# Patient Record
Sex: Male | Born: 1990 | Race: White | Hispanic: No | Marital: Married | Smoking: Former smoker
Health system: Southern US, Community
[De-identification: ages and names within clinical notes are randomized; demographics above are authoritative.]

## PROBLEM LIST (undated history)

## (undated) DIAGNOSIS — F819 Developmental disorder of scholastic skills, unspecified: Secondary | ICD-10-CM

## (undated) DIAGNOSIS — IMO0001 Reserved for inherently not codable concepts without codable children: Secondary | ICD-10-CM

## (undated) DIAGNOSIS — H9190 Unspecified hearing loss, unspecified ear: Secondary | ICD-10-CM

## (undated) DIAGNOSIS — F909 Attention-deficit hyperactivity disorder, unspecified type: Secondary | ICD-10-CM

## (undated) DIAGNOSIS — Z20828 Contact with and (suspected) exposure to other viral communicable diseases: Secondary | ICD-10-CM

## (undated) DIAGNOSIS — Z974 Presence of external hearing-aid: Secondary | ICD-10-CM

## (undated) DIAGNOSIS — K219 Gastro-esophageal reflux disease without esophagitis: Secondary | ICD-10-CM

## (undated) HISTORY — DX: Reserved for inherently not codable concepts without codable children: IMO0001

## (undated) HISTORY — PX: NO PAST SURGERIES: SHX2092

## (undated) HISTORY — DX: Attention-deficit hyperactivity disorder, unspecified type: F90.9

## (undated) HISTORY — DX: Developmental disorder of scholastic skills, unspecified: F81.9

## (undated) HISTORY — DX: Presence of external hearing-aid: Z97.4

## (undated) HISTORY — DX: Gastro-esophageal reflux disease without esophagitis: K21.9

---
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
## 2010-02-09 ENCOUNTER — Emergency Department (HOSPITAL_COMMUNITY): Admission: EM | Admit: 2010-02-09 | Discharge: 2010-02-10 | Payer: Self-pay | Admitting: Emergency Medicine

## 2010-06-02 LAB — CBC
HCT: 34.7 % — ABNORMAL LOW (ref 39.0–52.0)
Hemoglobin: 12.5 g/dL — ABNORMAL LOW (ref 13.0–17.0)
MCH: 31.3 pg (ref 26.0–34.0)
MCHC: 36 g/dL (ref 30.0–36.0)
RDW: 11.3 % — ABNORMAL LOW (ref 11.5–15.5)

## 2010-06-02 LAB — COMPREHENSIVE METABOLIC PANEL
CO2: 27 mEq/L (ref 19–32)
Calcium: 8.6 mg/dL (ref 8.4–10.5)
Creatinine, Ser: 0.82 mg/dL (ref 0.4–1.5)
GFR calc Af Amer: >60 mL/min (ref >60)
GFR calc non Af Amer: >60 mL/min (ref >60)
Glucose, Bld: 102 mg/dL — ABNORMAL HIGH (ref 70–99)

## 2010-06-02 LAB — URINALYSIS, ROUTINE W REFLEX MICROSCOPIC
Ketones, ur: NEGATIVE mg/dL
Nitrite: NEGATIVE
Specific Gravity, Urine: 1.01 (ref 1.005–1.030)
pH: 7 (ref 5.0–8.0)

## 2010-06-02 LAB — DIFFERENTIAL
Lymphocytes Relative: 28 % (ref 12–46)
Lymphs Abs: 2.2 10*3/uL (ref 0.7–4.0)
Neutro Abs: 3.9 10*3/uL (ref 1.7–7.7)
Neutrophils Relative %: 51 % (ref 43–77)

## 2011-10-10 ENCOUNTER — Emergency Department (HOSPITAL_COMMUNITY): Payer: PRIVATE HEALTH INSURANCE

## 2011-10-10 ENCOUNTER — Emergency Department (HOSPITAL_COMMUNITY)
Admission: EM | Admit: 2011-10-10 | Discharge: 2011-10-10 | Disposition: A | Discharge status: Home / Self Care | Payer: PRIVATE HEALTH INSURANCE | Attending: Emergency Medicine | Admitting: Emergency Medicine

## 2011-10-10 ENCOUNTER — Encounter (HOSPITAL_COMMUNITY): Payer: Self-pay | Admitting: *Deleted

## 2011-10-10 DIAGNOSIS — F17200 Nicotine dependence, unspecified, uncomplicated: Secondary | ICD-10-CM | POA: Insufficient documentation

## 2011-10-10 DIAGNOSIS — R109 Unspecified abdominal pain: Secondary | ICD-10-CM | POA: Insufficient documentation

## 2011-10-10 DIAGNOSIS — R509 Fever, unspecified: Secondary | ICD-10-CM | POA: Insufficient documentation

## 2011-10-10 DIAGNOSIS — R110 Nausea: Secondary | ICD-10-CM | POA: Insufficient documentation

## 2011-10-10 HISTORY — DX: Contact with and (suspected) exposure to other viral communicable diseases: Z20.828

## 2011-10-10 LAB — CBC WITH DIFFERENTIAL/PLATELET
Hemoglobin: 14.7 g/dL (ref 13.0–17.0)
Lymphocytes Relative: 36 % (ref 12–46)
Lymphs Abs: 1.2 10*3/uL (ref 0.7–4.0)
MCH: 31.4 pg (ref 26.0–34.0)
Monocytes Relative: 15 % — ABNORMAL HIGH (ref 3–12)
Neutro Abs: 1.6 10*3/uL — ABNORMAL LOW (ref 1.7–7.7)
Neutrophils Relative %: 48 % (ref 43–77)
RBC: 4.68 MIL/uL (ref 4.22–5.81)

## 2011-10-10 LAB — URINALYSIS, ROUTINE W REFLEX MICROSCOPIC
Glucose, UA: NEGATIVE mg/dL
Hgb urine dipstick: NEGATIVE
Protein, ur: NEGATIVE mg/dL

## 2011-10-10 LAB — COMPREHENSIVE METABOLIC PANEL
AST: 22 U/L (ref 0–37)
Albumin: 3.7 g/dL (ref 3.5–5.2)
Chloride: 96 mEq/L (ref 96–112)
Creatinine, Ser: 0.95 mg/dL (ref 0.50–1.35)
Potassium: 3.7 mEq/L (ref 3.5–5.1)
Total Bilirubin: 0.4 mg/dL (ref 0.3–1.2)

## 2011-10-10 LAB — LIPASE, BLOOD: Lipase: 27 U/L (ref 11–59)

## 2011-10-10 LAB — CK: Total CK: 72 U/L (ref 7–232)

## 2011-10-10 MED ORDER — ONDANSETRON HCL 4 MG/2ML IJ SOLN
4.0000 mg | Freq: Once | INTRAMUSCULAR | Status: AC
  Administered 2011-10-10: 4 mg via INTRAVENOUS
  Filled 2011-10-10: qty 2

## 2011-10-10 MED ORDER — ACETAMINOPHEN 325 MG PO TABS
650.0000 mg | ORAL_TABLET | Freq: Once | ORAL | Status: AC
  Administered 2011-10-10: 650 mg via ORAL
  Filled 2011-10-10: qty 2

## 2011-10-10 MED ORDER — SODIUM CHLORIDE 0.9 % IV BOLUS (SEPSIS): 1000.0000 mL | Freq: Once | INTRAVENOUS | Status: DC

## 2011-10-10 MED ORDER — SODIUM CHLORIDE 0.9 % IV BOLUS (SEPSIS)
1000.0000 mL | Freq: Once | INTRAVENOUS | Status: AC
  Administered 2011-10-10: 1000 mL via INTRAVENOUS

## 2011-10-10 MED ORDER — MORPHINE SULFATE 4 MG/ML IJ SOLN
4.0000 mg | Freq: Once | INTRAMUSCULAR | Status: AC
  Administered 2011-10-10: 4 mg via INTRAVENOUS
  Filled 2011-10-10: qty 1

## 2011-10-10 MED ORDER — IOHEXOL 300 MG/ML  SOLN
100.0000 mL | Freq: Once | INTRAMUSCULAR | Status: AC | PRN
  Administered 2011-10-10: 100 mL via INTRAVENOUS

## 2011-10-10 NOTE — ED Provider Notes (Signed)
History     CSN: [id number]  Arrival date & time 10/10/11  0003   First MD Initiated Contact with Patient 10/10/11 0036      Chief Complaint  Patient presents with  . Abdominal Pain    Patient is a 21 y.o. male presenting with abdominal pain. The history is provided by the patient and a relative.  Abdominal Pain The primary symptoms of the illness include abdominal pain, fever, fatigue and nausea. The primary symptoms of the illness do not include shortness of breath, vomiting or diarrhea. The current episode started more than 2 days ago. The onset of the illness was gradual. The problem has been gradually worsening.  Additional symptoms associated with the illness include chills and back pain. Associated symptoms comments: Cough Headache .   pt presents for abdominal pain for several days He also has had headache, cough as well No rash No significant sore throat  Pt started having fever about 5 days ago.  He then developed myalgias and abdominal pain He has seen PCP twice - the first time labs were drawn that "were normal" and suspected RMSF and was started on doxycycline.  He had tick bites about 3 weeks prior (less than 48 hours of exposure) He has not improved, saw PCP again and mono test that was positive     Past Medical History  Diagnosis Date  . 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 No pertinent past surgical history.  History reviewed. No pertinent family history.  History  Substance Use Topics  . Smoking status: Current Everyday Smoker  . Smokeless tobacco: Not on file  . Alcohol Use: No      Review of Systems  Constitutional: Positive for fever, chills and fatigue.  Respiratory: Negative for shortness of breath.   Gastrointestinal: Positive for nausea and abdominal pain. Negative for vomiting and diarrhea.  Musculoskeletal: Positive for back pain.  Skin: Negative for rash.  All other systems reviewed and are negative.    Allergies  Review of  patient's allergies indicates no known allergies.  Home Medications   Current Outpatient Rx  Name Route Sig Dispense Refill  . DOXYCYCLINE HYCLATE 100 MG PO CAPS Oral Take 100 mg by mouth 2 (two) times daily.    Marland Kitchen HYDROCODONE-ACETAMINOPHEN 5-325 MG PO TABS Oral Take 1 tablet by mouth every 6 (six) hours as needed.      BP 144/85  Pulse 96  Temp 101.4 F (38.6 C) (Oral)  Resp 20  Ht 6' (1.829 m)  Wt 148 lb (67.132 kg)  BMI 20.07 kg/m2  SpO2 99%  Physical Exam CONSTITUTIONAL: Well developed/well nourished HEAD AND FACE: Normocephalic/atraumatic EYES: EOMI/PERRL, no icterus ENMT: Mucous membranes moist, pharynx mildly erythematous, uvula midline NECK: supple no meningeal signs SPINE:entire spine nontender CV: S1/S2 noted, no murmurs/rubs/gallops noted LUNGS: Lungs are clear to auscultation bilaterally, no apparent distress ABDOMEN: soft, diffuse tenderness noted in RUQ and LUQ.  No significant lower abd tendernes no rebound or guarding GU:no cva tenderness NEURO: Pt is awake/alert, moves all extremitiesx4 EXTREMITIES: pulses normal, full ROM SKIN: warm, color normal, no rash PSYCH: no abnormalities of mood noted  ED Course  Procedures  Labs Reviewed  COMPREHENSIVE METABOLIC PANEL - Abnormal; Notable for the following:    Sodium 132 (*)     Glucose, Bld 103 (*)     All other components within normal limits  CBC WITH DIFFERENTIAL - Abnormal; Notable for the following:    WBC 3.3 (*)  RDW 11.4 (*)     Platelets 88 (*)     Neutro Abs 1.6 (*)     Monocytes Relative 15 (*)     Basophils Relative 2 (*)     All other components within normal limits  LIPASE, BLOOD  CK  LACTIC ACID, PLASMA  URINALYSIS, ROUTINE W REFLEX MICROSCOPIC   Dg Chest Port 1 View  10/10/2011  *RADIOLOGY REPORT*  Clinical Data: Abdominal pain.  Nausea, fever for 5 days.  PORTABLE CHEST - 1 VIEW  Comparison: None.  Findings: Cardiomediastinal silhouette is within normal limits. Lungs are free of  focal consolidations and pleural effusions.  No edema.  IMPRESSION: No evidence for acute cardiopulmonary abnormality.  Original Report Authenticated By: Patterson Hammersmith, M.D.   1:50 AM Pt with significant abdominal tenderness, CT imaging ordered Will follow closely  4:10 AM Pt improved Taking PO Vitals improved Abdomen soft on my exam Only abnormality is thrombocytopenia, which could be from viral process, or potentially tick borne illness He is already on doxycycline He is awake/alert, no signs of meningitis.  I don't see any rash consistent with tick illness He is already supposed to see his PCP next week when his doxy is complete. Discussed strict return precautions Patient/family agreeable  MDM  Nursing notes including past medical history and social history reviewed and considered in documentation xrays reviewed and considered labs/vitals reviewed and considered         Joya Gaskins, MD 10/10/11 [phone]

## 2011-10-10 NOTE — ED Notes (Signed)
Patient states he is feeling much better, states he is still pain free.  No nausea or vomiting.

## 2011-10-10 NOTE — ED Notes (Signed)
PO fluids offered.  Drinking without difficulty, no complain of nausea, no vomiting.

## 2011-10-10 NOTE — ED Notes (Signed)
Per family, pt has been diagnosed with mono on Tuesday, continued abdominal pain since that time.  No nausea or vomiting.

## 2011-10-10 NOTE — ED Notes (Signed)
States he has been sick since Tuesday, states he was diagnosed with mono.  He complains of left sided abdominal pain.

## 2012-06-01 ENCOUNTER — Emergency Department (HOSPITAL_COMMUNITY)
Admission: EM | Admit: 2012-06-01 | Discharge: 2012-06-02 | Disposition: A | Discharge status: Home / Self Care | Payer: PRIVATE HEALTH INSURANCE | Attending: Emergency Medicine | Admitting: Emergency Medicine

## 2012-06-01 ENCOUNTER — Encounter (HOSPITAL_COMMUNITY): Payer: Self-pay | Admitting: Emergency Medicine

## 2012-06-01 DIAGNOSIS — Z79899 Other long term (current) drug therapy: Secondary | ICD-10-CM | POA: Insufficient documentation

## 2012-06-01 DIAGNOSIS — Y929 Unspecified place or not applicable: Secondary | ICD-10-CM | POA: Insufficient documentation

## 2012-06-01 DIAGNOSIS — S0180XA Unspecified open wound of other part of head, initial encounter: Secondary | ICD-10-CM | POA: Insufficient documentation

## 2012-06-01 DIAGNOSIS — W208XXA Other cause of strike by thrown, projected or falling object, initial encounter: Secondary | ICD-10-CM | POA: Insufficient documentation

## 2012-06-01 DIAGNOSIS — S0003XA Contusion of scalp, initial encounter: Secondary | ICD-10-CM | POA: Insufficient documentation

## 2012-06-01 DIAGNOSIS — H9190 Unspecified hearing loss, unspecified ear: Secondary | ICD-10-CM | POA: Insufficient documentation

## 2012-06-01 DIAGNOSIS — F17200 Nicotine dependence, unspecified, uncomplicated: Secondary | ICD-10-CM | POA: Insufficient documentation

## 2012-06-01 DIAGNOSIS — Y9389 Activity, other specified: Secondary | ICD-10-CM | POA: Insufficient documentation

## 2012-06-01 HISTORY — DX: Unspecified hearing loss, unspecified ear: H91.90

## 2012-06-01 MED ORDER — DOUBLE ANTIBIOTIC 500-10000 UNIT/GM EX OINT
TOPICAL_OINTMENT | Freq: Once | CUTANEOUS | Status: AC
  Administered 2012-06-01: 1 via TOPICAL
  Filled 2012-06-01: qty 1

## 2012-06-01 MED ORDER — LIDOCAINE-EPINEPHRINE (PF) 1 %-1:200000 IJ SOLN
INTRAMUSCULAR | Status: AC
  Administered 2012-06-01: 10 mL
  Filled 2012-06-01: qty 10

## 2012-06-01 MED ORDER — HYDROCODONE-ACETAMINOPHEN 5-325 MG PO TABS: 1.0000 | ORAL_TABLET | ORAL | Status: DC | PRN

## 2012-06-01 NOTE — ED Provider Notes (Signed)
Patient was working on his vehicle and the old broke and his ranch flew back and hit him in the right forehead. He did not have loss of consciousness. He has a vertically placed laceration in his mid eyebrow that extends into the upper eyelid but does not involve the orbital fat. His globe appears intact. The laceration is through the dermis into the subcutaneous tissue. There is no active bleeding at this point. He did feel a little weak and lightheaded when he first walked into the emergency department but he feels better now.  His last tetanus is unknown.  He has a family member with him who is a Engineer, civil (consulting) and can do neuro checks on him at home.   Medical screening examination/treatment/procedure(s) were conducted as a shared visit with non-physician practitioner(s) and myself.  I personally evaluated the patient during the encounter  Devoria Albe, MD, Franz Dell, MD 06/01/12 [phone]

## 2012-06-01 NOTE — ED Notes (Signed)
Patient was working on a vehicle and a bolt broke and the wrench hit him in the head; denies LOC.  Patient with approximately 2 inch laceration noted through right eyebrow.  Patient is dizzy when he stands.

## 2012-06-01 NOTE — ED Provider Notes (Signed)
History     CSN: [id number]  Arrival date & time 06/01/12  2020   First MD Initiated Contact with Patient 06/01/12 2253      Chief Complaint  Patient presents with  . Head Laceration    (Consider location/radiation/quality/duration/timing/severity/associated sxs/prior treatment) Patient is a 22 y.o. male presenting with scalp laceration. The history is provided by the patient.  Head Laceration This is a new problem. The current episode started today. The problem occurs constantly. The problem has been unchanged. Associated symptoms include headaches. Pertinent negatives include no nausea or vomiting. Nothing aggravates the symptoms. He has tried nothing for the symptoms.  patient was working on a car pulling out a transmission and a wrench slipped and flew off and hit him in the head just above his right eye. Bleeding is controlled.  Past Medical History  Diagnosis Date  . 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 Hard of hearing     History reviewed. No pertinent past surgical history.  No family history on file.  History  Substance Use Topics  . Smoking status: Current Every Day Smoker  . Smokeless tobacco: Not on file  . Alcohol Use: Yes      Review of Systems  HENT: Negative for facial swelling.        Laceration   Gastrointestinal: Negative for nausea and vomiting.  Skin: Positive for wound.  Allergic/Immunologic: Negative for immunocompromised state.  Neurological: Positive for headaches.  Psychiatric/Behavioral: Negative for confusion.    Allergies  Review of patient's allergies indicates no known allergies.  Home Medications   Current Outpatient Rx  Name  Route  Sig  Dispense  Refill  . doxycycline (VIBRAMYCIN) 100 MG capsule   Oral   Take 100 mg by mouth 2 (two) times daily.         Marland Kitchen HYDROcodone-acetaminophen (NORCO/VICODIN) 5-325 MG per tablet   Oral   Take 1 tablet by mouth every 6 (six) hours as needed.           BP 130/88  Pulse 77  Temp(Src) 97.9 F  (36.6 C) (Oral)  Resp 24  Ht 6' (1.829 m)  Wt 150 lb (68.04 kg)  BMI 20.34 kg/m2  SpO2 100%  Physical Exam  Constitutional: He is oriented to person, place, and time. He appears well-developed and well-nourished. No distress.  HENT:  Head: Head is with laceration.    Eyes: Conjunctivae and EOM are normal. Pupils are equal, round, and reactive to light.  Neck: Normal range of motion. Neck supple.  Cardiovascular: Normal rate.   Pulmonary/Chest: Effort normal.  Musculoskeletal: Normal range of motion.  Neurological: He is alert and oriented to person, place, and time.  Psychiatric: He has a normal mood and affect. His behavior is normal. Judgment and thought content normal.    ED Course  Procedures (including critical care time) LACERATION REPAIR Performed by: NEESE,HOPE Authorized by: NEESE,HOPE Consent: Verbal consent obtained. Risks and benefits: risks, benefits and alternatives were discussed Consent given by: patient Patient identity confirmed: provided demographic data Prepped and Draped in normal sterile fashion Wound explored  Laceration Location: face  Laceration Length: 5 cm  No Foreign Bodies seen or palpated  Anesthesia: local infiltration  Local anesthetic: lidocaine 1%  With epinephrine  Anesthetic total: 3 ml  Irrigation method: syringe Amount of cleaning: standard  Skin closure: sub Q with 5-0 vicryl and skin with  6-0 prolene  Number of sutures: 4 sub Q and 13 skin  Technique: simple interrupted  Patient tolerance: Patient  tolerated the procedure well with no immediate complications.   MDM  22 y.o. male who presents to the ED with a facial laceration. He denies LOC. He is alert and oriented. Dr. Lars Mage in to examine the patient and discussed close observation and/or CT scan.  His wife is a Engineer, civil (consulting) and she feels she can observe the patient and return for any problems. Will d/c home to follow up with Dr. Gerda Diss for suture removal in 5 days .  Head injury precautions and sutured wound care instructions given prior to discharge.   I have reviewed this patient's vital signs, nurses notes and discussed plan of care with patient and his wife.    Medication List    TAKE these medications       HYDROcodone-acetaminophen 5-325 MG per tablet  Commonly known as:  NORCO/VICODIN  Take 1 tablet by mouth every 4 (four) hours as needed for pain.      ASK your doctor about these medications       calcium carbonate 1250 MG chewable tablet  Commonly known as:  OS-CAL  Chew 1 tablet by mouth daily as needed for heartburn.             Janne Napoleon, [place] 06/01/12 [phone]

## 2012-06-02 NOTE — ED Provider Notes (Signed)
See prior note    Ward Givens, MD 06/02/12 [phone]

## 2012-06-07 ENCOUNTER — Ambulatory Visit (INDEPENDENT_AMBULATORY_CARE_PROVIDER_SITE_OTHER): Payer: PRIVATE HEALTH INSURANCE | Admitting: Family Medicine

## 2012-06-07 ENCOUNTER — Encounter: Payer: Self-pay | Admitting: Family Medicine

## 2012-06-07 VITALS — BP 134/92 | Temp 99.0°F | Wt 161.8 lb

## 2012-06-07 DIAGNOSIS — N419 Inflammatory disease of prostate, unspecified: Secondary | ICD-10-CM

## 2012-06-07 LAB — POCT URINALYSIS DIP (MANUAL ENTRY)
Glucose, UA: NEGATIVE
Nitrite, UA: NEGATIVE
Spec Grav, UA: <=1.005
Urobilinogen, UA: NEGATIVE

## 2012-06-07 LAB — POCT UA - MICROSCOPIC ONLY: WBC, Ur, HPF, POC: NEGATIVE

## 2012-06-07 MED ORDER — CIPROFLOXACIN HCL 500 MG PO TABS: 500.0000 mg | ORAL_TABLET | Freq: Two times a day (BID) | ORAL | Status: AC

## 2012-06-07 MED ORDER — ONDANSETRON 4 MG PO TBDP: 4.0000 mg | ORAL_TABLET | Freq: Three times a day (TID) | ORAL | Status: DC | PRN

## 2012-06-07 NOTE — Progress Notes (Signed)
  Subjective:    Patient ID: Tony Diaz, male    DOB: 19-Apr-1990, 22 y.o.   MRN: [id number]  Abdominal Pain This is a new problem. The current episode started in the past 7 days. The onset quality is gradual. The problem occurs constantly. The problem has been rapidly improving. The pain is located in the LLQ. The pain is at a severity of 3/10. The quality of the pain is aching. The abdominal pain does not radiate. Associated symptoms include dysuria, a fever and myalgias. Nothing aggravates the pain. He has tried acetaminophen for the symptoms. The treatment provided no relief.      Review of Systems  Constitutional: Positive for fever and chills.  Eyes: Positive for discharge.  Gastrointestinal: Positive for abdominal pain.  Genitourinary: Positive for dysuria and difficulty urinating (history of acute prostatiis tw months ago).  Musculoskeletal: Positive for myalgias.  All other systems reviewed and are negative.       Objective:   Physical Exam  Vitals reviewed. Constitutional: He appears well-developed.  HENT:  Head: Normocephalic.  Eyes: Conjunctivae are normal. Pupils are equal, round, and reactive to light. Right eye exhibits no discharge.  Neck: Normal range of motion.  Cardiovascular: Normal rate and regular rhythm.   No murmur heard. Abdominal: Soft. There is no tenderness.  Genitourinary:  Prostate gland distinctly swollen and tender           Assessment & Plan:  Impression #1 acute prostatitis. Accompanied by nausea. Plan Cipro twice a day for 34 days. Zofran as needed for nausea. Symptomatic care discussed. Warning signs discussed. WSL

## 2012-06-07 NOTE — Addendum Note (Signed)
Addended by: Donna Bernard on: 06/07/2012 04:03 PM   Modules accepted: Orders

## 2012-06-09 ENCOUNTER — Encounter: Payer: Self-pay | Admitting: *Deleted

## 2012-06-14 MED FILL — Hydrocodone-Acetaminophen Tab 5-325 MG: ORAL | Qty: 6 | Status: AC

## 2012-12-04 ENCOUNTER — Ambulatory Visit (INDEPENDENT_AMBULATORY_CARE_PROVIDER_SITE_OTHER): Payer: PRIVATE HEALTH INSURANCE | Admitting: Family Medicine

## 2012-12-04 ENCOUNTER — Encounter: Payer: Self-pay | Admitting: Family Medicine

## 2012-12-04 VITALS — BP 124/88 | Temp 99.0°F | Ht 72.0 in | Wt 159.2 lb

## 2012-12-04 DIAGNOSIS — N410 Acute prostatitis: Secondary | ICD-10-CM

## 2012-12-04 DIAGNOSIS — R300 Dysuria: Secondary | ICD-10-CM

## 2012-12-04 DIAGNOSIS — J329 Chronic sinusitis, unspecified: Secondary | ICD-10-CM

## 2012-12-04 LAB — POCT URINALYSIS DIPSTICK

## 2012-12-04 MED ORDER — ETODOLAC 400 MG PO TABS: 400.0000 mg | ORAL_TABLET | Freq: Two times a day (BID) | ORAL | Status: DC | PRN

## 2012-12-04 MED ORDER — CIPROFLOXACIN HCL 750 MG PO TABS: 750.0000 mg | ORAL_TABLET | Freq: Two times a day (BID) | ORAL | Status: AC

## 2012-12-04 NOTE — Progress Notes (Signed)
  Subjective:    Patient ID: Tony Diaz, male    DOB: [birth year], 22 y.o.   MRN: [id number]  Sinusitis This is a new problem. The current episode started in the past 7 days. The problem is unchanged. The maximum temperature recorded prior to his arrival was 100 - 100.9 F. The pain is moderate. Associated symptoms include congestion, coughing and sinus pressure. Past treatments include nothing. The treatment provided no relief.  Knee Pain  The incident occurred more than 1 week ago. There was no injury mechanism. The pain is present in the right knee and left knee. The quality of the pain is described as aching. The pain is at a severity of 6/10. The pain is moderate. The pain has been intermittent since onset. He reports no foreign bodies present. Nothing aggravates the symptoms. He has tried rest for the symptoms. The treatment provided no relief.  tooth ache in knee,  Patient also thinks he has a prostate infection. It started yesterday morning with the discomfort.  Increased urinary frewq, infected prostate hx,     Review of Systems  HENT: Positive for congestion and sinus pressure.   Respiratory: Positive for cough.        Objective:   Physical Exam Alert moderate malaise. HET moderate his congestion pharynx erythematous neck supple. Lungs clear heart regular in rhythm prostate gland inflamed tender next  Urine 2-4 white blood cells per high-power field.       Assessment & Plan:  Impression acute prostatitis. #2 rhinosinusitis. #3 knee pain. Plan try Lodine 400 twice a day with food when necessary for pain. Cipro 750 twice a day 21 days. Symptomatic care discussed. WSL

## 2013-02-07 ENCOUNTER — Encounter (HOSPITAL_COMMUNITY): Payer: Self-pay | Admitting: Emergency Medicine

## 2013-02-07 ENCOUNTER — Emergency Department (HOSPITAL_COMMUNITY)
Admission: EM | Admit: 2013-02-07 | Discharge: 2013-02-07 | Disposition: A | Discharge status: Home / Self Care | Payer: PRIVATE HEALTH INSURANCE | Attending: Emergency Medicine | Admitting: Emergency Medicine

## 2013-02-07 ENCOUNTER — Emergency Department (HOSPITAL_COMMUNITY): Payer: PRIVATE HEALTH INSURANCE

## 2013-02-07 DIAGNOSIS — S139XXA Sprain of joints and ligaments of unspecified parts of neck, initial encounter: Secondary | ICD-10-CM | POA: Insufficient documentation

## 2013-02-07 DIAGNOSIS — Y92410 Unspecified street and highway as the place of occurrence of the external cause: Secondary | ICD-10-CM | POA: Diagnosis not present

## 2013-02-07 DIAGNOSIS — S161XXA Strain of muscle, fascia and tendon at neck level, initial encounter: Secondary | ICD-10-CM

## 2013-02-07 DIAGNOSIS — H9190 Unspecified hearing loss, unspecified ear: Secondary | ICD-10-CM | POA: Diagnosis not present

## 2013-02-07 DIAGNOSIS — S335XXA Sprain of ligaments of lumbar spine, initial encounter: Secondary | ICD-10-CM | POA: Insufficient documentation

## 2013-02-07 DIAGNOSIS — Z8719 Personal history of other diseases of the digestive system: Secondary | ICD-10-CM | POA: Insufficient documentation

## 2013-02-07 DIAGNOSIS — Z789 Other specified health status: Secondary | ICD-10-CM | POA: Insufficient documentation

## 2013-02-07 DIAGNOSIS — Y9389 Activity, other specified: Secondary | ICD-10-CM | POA: Diagnosis not present

## 2013-02-07 DIAGNOSIS — S0993XA Unspecified injury of face, initial encounter: Secondary | ICD-10-CM | POA: Diagnosis present

## 2013-02-07 DIAGNOSIS — Z8659 Personal history of other mental and behavioral disorders: Secondary | ICD-10-CM | POA: Insufficient documentation

## 2013-02-07 DIAGNOSIS — S39012A Strain of muscle, fascia and tendon of lower back, initial encounter: Secondary | ICD-10-CM

## 2013-02-07 MED ORDER — CYCLOBENZAPRINE HCL 5 MG PO TABS: 5.0000 mg | ORAL_TABLET | Freq: Three times a day (TID) | ORAL | Status: DC | PRN

## 2013-02-07 MED ORDER — IBUPROFEN 600 MG PO TABS: 600.0000 mg | ORAL_TABLET | Freq: Four times a day (QID) | ORAL | Status: DC | PRN
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
## 2013-02-09 NOTE — ED Provider Notes (Signed)
Medical screening examination/treatment/procedure(s) were performed by non-physician practitioner and as supervising physician I was immediately available for consultation/collaboration.  Zissy Hamlett L Dewana Ammirati, MD 02/09/13 1518 

## 2013-04-30 ENCOUNTER — Encounter: Payer: Self-pay | Admitting: Family Medicine

## 2013-04-30 ENCOUNTER — Ambulatory Visit (INDEPENDENT_AMBULATORY_CARE_PROVIDER_SITE_OTHER): Payer: PRIVATE HEALTH INSURANCE | Admitting: Family Medicine

## 2013-04-30 VITALS — BP 118/78 | Temp 98.9°F | Ht 72.0 in | Wt 160.6 lb

## 2013-04-30 DIAGNOSIS — N410 Acute prostatitis: Secondary | ICD-10-CM

## 2013-04-30 MED ORDER — DOXYCYCLINE HYCLATE 100 MG PO TABS: 100.0000 mg | ORAL_TABLET | Freq: Two times a day (BID) | ORAL | Status: DC

## 2013-04-30 NOTE — Progress Notes (Signed)
   Subjective:    Patient ID: Tony HubertJerry Plush, male    DOB: 08/04/1990, 23 y.o.   MRN: [id number]  HPI Patient arrives with cough for few months. Patient reported that he started yesterday with body aches- also thinks he has another prostate infection-has a hx of this.  Non smoker  achey urinating some nore than ususal  Results for orders placed in visit on 12/04/12  POCT URINALYSIS DIPSTICK      Result Value Range   Color, UA       Clarity, UA       Glucose, UA       Bilirubin, UA       Ketones, UA       Spec Grav, UA <=1.005     Blood, UA       pH, UA 6.0     Protein, UA       Urobilinogen, UA       Nitrite, UA       Leukocytes, UA       Lobe domino discomfort off and on. Positive increased urination. Positive dysuria. Low back discomfort. Achy. Next  Diminished energy no high fevers but felt definite chills. Review of Systems No headache no rash no vomiting no diarrhea ROS otherwise negative    Objective:   Physical Exam Alert moderate malaise hydration good H&T normal. Lungs clear. Heart rare rhythm. Abdomen benign. Prostate boggy and tender.       Assessment & Plan:  Impression acute prostate infection discussed plan Doxy 100 twice a day 10 days. Symptomatic care discussed. WSL

## 2013-08-03 ENCOUNTER — Ambulatory Visit (INDEPENDENT_AMBULATORY_CARE_PROVIDER_SITE_OTHER): Payer: PRIVATE HEALTH INSURANCE | Admitting: Family Medicine

## 2013-08-03 ENCOUNTER — Encounter: Payer: Self-pay | Admitting: Family Medicine

## 2013-08-03 VITALS — BP 124/62 | HR 60 | Ht 73.0 in | Wt 156.0 lb

## 2013-08-03 DIAGNOSIS — K219 Gastro-esophageal reflux disease without esophagitis: Secondary | ICD-10-CM

## 2013-08-03 DIAGNOSIS — Z0189 Encounter for other specified special examinations: Secondary | ICD-10-CM

## 2013-08-03 DIAGNOSIS — R079 Chest pain, unspecified: Secondary | ICD-10-CM

## 2013-08-03 MED ORDER — PANTOPRAZOLE SODIUM 40 MG PO TBEC: 40.0000 mg | DELAYED_RELEASE_TABLET | Freq: Every day | ORAL | Status: DC

## 2013-08-03 NOTE — Progress Notes (Signed)
   Subjective:    Patient ID: Tony Diaz, male    DOB: 07-27-1990, 23 y.o.   MRN: [id number]  HPI physical exam sparatic chest pain ( on the side of the heart)  Feels like an ache  Worse with spicey foods, lasts thirty min or so, can occur any old time  Movement or deep breath does not affect  No toabacco smoke some dipping  Diet:  Eats good at home, at work eats poorly  No sob, just an ache or "gas bubble"  No pain in chrst with exertion   Review of Systems  Constitutional: Negative for fever, activity change and appetite change.  HENT: Negative for congestion and rhinorrhea.   Eyes: Negative for discharge.  Respiratory: Negative for cough and wheezing.   Cardiovascular: Negative for chest pain.  Gastrointestinal: Negative for vomiting, abdominal pain and blood in stool.  Genitourinary: Negative for frequency and difficulty urinating.  Musculoskeletal: Negative for neck pain.  Skin: Negative for rash.  Allergic/Immunologic: Negative for environmental allergies and food allergies.  Neurological: Negative for weakness and headaches.  Psychiatric/Behavioral: Negative for agitation.  All other systems reviewed and are negative.      Objective:   Physical Exam  Vitals reviewed. Constitutional: He appears well-developed and well-nourished.  HENT:  Head: Normocephalic and atraumatic.  Right Ear: External ear normal.  Left Ear: External ear normal.  Nose: Nose normal.  Mouth/Throat: Oropharynx is clear and moist.  Eyes: EOM are normal. Pupils are equal, round, and reactive to light.  Neck: Normal range of motion. Neck supple. No thyromegaly present.  Cardiovascular: Normal rate, regular rhythm and normal heart sounds.   No murmur heard. Pulmonary/Chest: Effort normal and breath sounds normal. No respiratory distress. He has no wheezes.  Abdominal: Soft. Bowel sounds are normal. He exhibits no distension and no mass. There is no tenderness.  Genitourinary: Penis  normal.  Musculoskeletal: Normal range of motion. He exhibits no edema.  Lymphadenopathy:    He has no cervical adenopathy.  Neurological: He is alert. He exhibits normal muscle tone.  Skin: Skin is warm and dry. No erythema.  Psychiatric: He has a normal mood and affect. His behavior is normal. Judgment normal.          Assessment & Plan:  Impression 1 wellness exam #2 atypical chest discomfort likely related to reflux. Discussed. Plan appropriate blood work. Diet exercise discussed in encourage. Patient encouraged to stop dipping tobacco. Appropriate blood work. Trial protonic 40 daily. Educational information given. WSL

## 2013-08-04 DIAGNOSIS — K219 Gastro-esophageal reflux disease without esophagitis: Secondary | ICD-10-CM | POA: Insufficient documentation

## 2013-08-04 LAB — LIPID PANEL
CHOLESTEROL: 138 mg/dL (ref 0–200)
HDL: 56 mg/dL (ref >39)
LDL Cholesterol: 67 mg/dL (ref 0–99)
TRIGLYCERIDES: 75 mg/dL (ref <150)
Total CHOL/HDL Ratio: 2.5 Ratio
VLDL: 15 mg/dL (ref 0–40)

## 2013-08-04 LAB — GLUCOSE, RANDOM: Glucose, Bld: 78 mg/dL (ref 70–99)

## 2013-08-05 ENCOUNTER — Encounter: Payer: Self-pay | Admitting: Family Medicine

## 2013-08-22 ENCOUNTER — Encounter: Payer: Self-pay | Admitting: Family Medicine

## 2014-08-12 ENCOUNTER — Encounter: Payer: Self-pay | Admitting: Family Medicine

## 2014-08-12 ENCOUNTER — Ambulatory Visit (INDEPENDENT_AMBULATORY_CARE_PROVIDER_SITE_OTHER): Payer: PRIVATE HEALTH INSURANCE | Admitting: Family Medicine

## 2014-08-12 VITALS — BP 132/80 | Temp 98.4°F | Ht 73.0 in | Wt 156.0 lb

## 2014-08-12 DIAGNOSIS — N410 Acute prostatitis: Secondary | ICD-10-CM | POA: Diagnosis not present

## 2014-08-12 DIAGNOSIS — R300 Dysuria: Secondary | ICD-10-CM

## 2014-08-12 LAB — POCT URINALYSIS DIPSTICK
PH UA: 7
Spec Grav, UA: <=1.005

## 2014-08-12 MED ORDER — DOXYCYCLINE HYCLATE 100 MG PO TABS: 100.0000 mg | ORAL_TABLET | Freq: Two times a day (BID) | ORAL | Status: DC

## 2014-08-12 NOTE — Patient Instructions (Signed)
This is a true prostate infeection alpong with a possible sinus infection  Unfortunately some young fellows have a tendency towards occasional prostate infections. We will be happy to set up with a urologist so they may assess you as to why that is.  Generally we do not do a psa blood test on very young men (under age 8forty). If drawn now with an infected prostate gland, the numbers would definitely be elevated and not vry helpful. The urologist will comment on this further also

## 2014-08-12 NOTE — Progress Notes (Signed)
   Subjective:    Patient ID: Tony Diaz, male    DOB: Aug 17, 1990, 24 y.o.   MRN: [id number]  HPIfever, body aches, and runny nose. Started 2 days ago. Taking tylenol.day and night incr urination  achey in the low back and achey in the low abd  notd incr achey and sweaty and dim energy  Day numb: [phone]  Painful urination started 2 days ago. Taking tyenol and azo.   Review of Systems Some drainage and runny nose not had a diminished appetite    Objective:   Physical Exam Alert mild malaise HET sinus congestion for his normal lungs clear. Heart rare rhythm. Prostate boggy and very tender       Assessment & Plan:  Impression acute prostatitis with URI plan appropriate antibiotics prescribed. Urology referral per spouse request. WSL

## 2014-08-30 ENCOUNTER — Encounter: Payer: Self-pay | Admitting: Family Medicine

## 2015-01-19 ENCOUNTER — Emergency Department (HOSPITAL_COMMUNITY): Payer: PRIVATE HEALTH INSURANCE

## 2015-01-19 ENCOUNTER — Emergency Department (HOSPITAL_COMMUNITY)
Admission: EM | Admit: 2015-01-19 | Discharge: 2015-01-19 | Disposition: A | Discharge status: Home / Self Care | Payer: PRIVATE HEALTH INSURANCE | Attending: Emergency Medicine | Admitting: Emergency Medicine

## 2015-01-19 ENCOUNTER — Encounter (HOSPITAL_COMMUNITY): Payer: Self-pay | Admitting: Emergency Medicine

## 2015-01-19 DIAGNOSIS — Y9389 Activity, other specified: Secondary | ICD-10-CM | POA: Insufficient documentation

## 2015-01-19 DIAGNOSIS — Z720 Tobacco use: Secondary | ICD-10-CM | POA: Diagnosis not present

## 2015-01-19 DIAGNOSIS — S0101XA Laceration without foreign body of scalp, initial encounter: Secondary | ICD-10-CM | POA: Insufficient documentation

## 2015-01-19 DIAGNOSIS — W228XXA Striking against or struck by other objects, initial encounter: Secondary | ICD-10-CM | POA: Diagnosis not present

## 2015-01-19 DIAGNOSIS — Z974 Presence of external hearing-aid: Secondary | ICD-10-CM | POA: Insufficient documentation

## 2015-01-19 DIAGNOSIS — Z23 Encounter for immunization: Secondary | ICD-10-CM | POA: Insufficient documentation

## 2015-01-19 DIAGNOSIS — S0990XA Unspecified injury of head, initial encounter: Secondary | ICD-10-CM | POA: Diagnosis present

## 2015-01-19 DIAGNOSIS — K219 Gastro-esophageal reflux disease without esophagitis: Secondary | ICD-10-CM | POA: Insufficient documentation

## 2015-01-19 DIAGNOSIS — Y9289 Other specified places as the place of occurrence of the external cause: Secondary | ICD-10-CM | POA: Diagnosis not present

## 2015-01-19 DIAGNOSIS — H9190 Unspecified hearing loss, unspecified ear: Secondary | ICD-10-CM | POA: Insufficient documentation

## 2015-01-19 DIAGNOSIS — S40012A Contusion of left shoulder, initial encounter: Secondary | ICD-10-CM | POA: Diagnosis not present

## 2015-01-19 DIAGNOSIS — S40212A Abrasion of left shoulder, initial encounter: Secondary | ICD-10-CM | POA: Insufficient documentation

## 2015-01-19 DIAGNOSIS — Y998 Other external cause status: Secondary | ICD-10-CM | POA: Insufficient documentation

## 2015-01-19 DIAGNOSIS — Z792 Long term (current) use of antibiotics: Secondary | ICD-10-CM | POA: Diagnosis not present

## 2015-01-19 DIAGNOSIS — Z79899 Other long term (current) drug therapy: Secondary | ICD-10-CM | POA: Insufficient documentation

## 2015-01-19 DIAGNOSIS — Z8659 Personal history of other mental and behavioral disorders: Secondary | ICD-10-CM | POA: Diagnosis not present

## 2015-01-19 MED ORDER — TETANUS-DIPHTH-ACELL PERTUSSIS 5-2.5-18.5 LF-MCG/0.5 IM SUSP
0.5000 mL | Freq: Once | INTRAMUSCULAR | Status: AC
  Administered 2015-01-19: 0.5 mL via INTRAMUSCULAR
  Filled 2015-01-19: qty 0.5

## 2015-01-19 MED ORDER — LIDOCAINE-EPINEPHRINE 1 %-1:100000 IJ SOLN
10.0000 mL | Freq: Once | INTRAMUSCULAR | Status: AC
  Administered 2015-01-19: 10 mL

## 2015-01-19 MED ORDER — LIDOCAINE HCL (PF) 1 % IJ SOLN
INTRAMUSCULAR | Status: AC
  Filled 2015-01-19: qty 5

## 2015-01-19 NOTE — ED Notes (Signed)
MD at the bedside  

## 2015-01-19 NOTE — ED Notes (Signed)
Abrasion to left clavicle
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
## 2015-01-19 NOTE — Discharge Instructions (Signed)
Staples out 7-10 days.   Head Injury, Adult You have a head injury. Headaches and throwing up (vomiting) are common after a head injury. It should be easy to wake up from sleeping. Sometimes you must stay in the hospital. Most problems happen within the first 24 hours. Side effects may occur up to 7-10 days after the injury.  WHAT ARE THE TYPES OF HEAD INJURIES? Head injuries can be as minor as a bump. Some head injuries can be more severe. More severe head injuries include:  A jarring injury to the brain (concussion).  A bruise of the brain (contusion). This mean there is bleeding in the brain that can cause swelling.  A cracked skull (skull fracture).  Bleeding in the brain that collects, clots, and forms a bump (hematoma). WHEN SHOULD I GET HELP RIGHT AWAY?   You are confused or sleepy.  You cannot be woken up.  You feel sick to your stomach (nauseous) or keep throwing up (vomiting).  Your dizziness or unsteadiness is getting worse.  You have very bad, lasting headaches that are not helped by medicine. Take medicines only as told by your doctor.  You cannot use your arms or legs like normal.  You cannot walk.  You notice changes in the black spots in the center of the colored part of your eye (pupil).  You have clear or bloody fluid coming from your nose or ears.  You have trouble seeing. During the next 24 hours after the injury, you must stay with someone who can watch you. This person should get help right away (call 911 in the [place] if you start to shake and are not able to control it (have seizures), you pass out, or you are unable to wake up. HOW CAN I PREVENT A HEAD INJURY IN THE FUTURE?  Wear seat belts.  Wear a helmet while bike riding and playing sports like football.  Stay away from dangerous activities around the house. WHEN CAN I RETURN TO NORMAL ACTIVITIES AND ATHLETICS? See your doctor before doing these activities. You should not do normal activities or  play contact sports until 1 week after the following symptoms have stopped:  Headache that does not go away.  Dizziness.  Poor attention.  Confusion.  Memory problems.  Sickness to your stomach or throwing up.  Tiredness.  Fussiness.  Bothered by bright lights or loud noises.  Anxiousness or depression.  Restless sleep. MAKE SURE YOU:   Understand these instructions.  Will watch your condition.  Will get help right away if you are not doing well or get worse.   This information is not intended to replace advice given to you by your health care provider. Make sure you discuss any questions you have with your health care provider.   Document Released: 02/19/2008 Document Revised: 03/29/2014 Document Reviewed: 11/13/2012 Elsevier Interactive Patient Education 2016 [employer]  Laceration Care, Adult A laceration is a cut that goes through all layers of the skin. The cut also goes into the tissue that is right under the skin. Some cuts heal on their own. Others need to be closed with stitches (sutures), staples, skin adhesive strips, or wound glue. Taking care of your cut lowers your risk of infection and helps your cut to heal better. HOW TO TAKE CARE OF YOUR CUT For stitches or staples:  Keep the wound clean and dry.  If you were given a bandage (dressing), you should change it at least one time per day or as told  by your doctor. You should also change it if it gets wet or dirty.  Keep the wound completely dry for the first 24 hours or as told by your doctor. After that time, you may take a shower or a bath. However, make sure that the wound is not soaked in water until after the stitches or staples have been removed.  Clean the wound one time each day or as told by your doctor:  Wash the wound with soap and water.  Rinse the wound with water until all of the soap comes off.  Pat the wound dry with a clean towel. Do not rub the wound.  After you clean the  wound, put a thin layer of antibiotic ointment on it as told by your doctor. This ointment:  Helps to prevent infection.  Keeps the bandage from sticking to the wound.  Have your stitches or staples removed as told by your doctor. If your doctor used skin adhesive strips:   Keep the wound clean and dry.  If you were given a bandage, you should change it at least one time per day or as told by your doctor. You should also change it if it gets dirty or wet.  Do not get the skin adhesive strips wet. You can take a shower or a bath, but be careful to keep the wound dry.  If the wound gets wet, pat it dry with a clean towel. Do not rub the wound.  Skin adhesive strips fall off on their own. You can trim the strips as the wound heals. Do not remove any strips that are still stuck to the wound. They will fall off after a while. If your doctor used wound glue:  Try to keep your wound dry, but you may briefly wet it in the shower or bath. Do not soak the wound in water, such as by swimming.  After you take a shower or a bath, gently pat the wound dry with a clean towel. Do not rub the wound.  Do not do any activities that will make you really sweaty until the skin glue has fallen off on its own.  Do not apply liquid, cream, or ointment medicine to your wound while the skin glue is still on.  If you were given a bandage, you should change it at least one time per day or as told by your doctor. You should also change it if it gets dirty or wet.  If a bandage is placed over the wound, do not let the tape for the bandage touch the skin glue.  Do not pick at the glue. The skin glue usually stays on for 5-10 days. Then, it falls off of the skin. General Instructions  To help prevent scarring, make sure to cover your wound with sunscreen whenever you are outside after stitches are removed, after adhesive strips are removed, or when wound glue stays in place and the wound is healed. Make sure to  wear a sunscreen of at least 30 SPF.  Take over-the-counter and prescription medicines only as told by your doctor.  If you were given antibiotic medicine or ointment, take or apply it as told by your doctor. Do not stop using the antibiotic even if your wound is getting better.  Do not scratch or pick at the wound.  Keep all follow-up visits as told by your doctor. This is important.  Check your wound every day for signs of infection. Watch for:  Redness, swelling, or pain.  Fluid, blood, or pus.  Raise (elevate) the injured area above the level of your heart while you are sitting or lying down, if possible. GET HELP IF:  You got a tetanus shot and you have any of these problems at the injection site:  Swelling.  Very bad pain.  Redness.  Bleeding.  You have a fever.  A wound that was closed breaks open.  You notice a bad smell coming from your wound or your bandage.  You notice something coming out of the wound, such as wood or glass.  Medicine does not help your pain.  You have more redness, swelling, or pain at the site of your wound.  You have fluid, blood, or pus coming from your wound.  You notice a change in the color of your skin near your wound.  You need to change the bandage often because fluid, blood, or pus is coming from the wound.  You start to have a new rash.  You start to have numbness around the wound. GET HELP RIGHT AWAY IF:  You have very bad swelling around the wound.  Your pain suddenly gets worse and is very bad.  You notice painful lumps near the wound or on skin that is anywhere on your body.  You have a red streak going away from your wound.  The wound is on your hand or foot and you cannot move a finger or toe like you usually can.  The wound is on your hand or foot and you notice that your fingers or toes look pale or bluish.   This information is not intended to replace advice given to you by your health care provider.  Make sure you discuss any questions you have with your health care provider.   Document Released: 08/25/2007 Document Revised: 07/23/2014 Document Reviewed: 03/04/2014 Elsevier Interactive Patient Education [employer]
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
## 2015-08-11 ENCOUNTER — Encounter: Payer: Self-pay | Admitting: Family Medicine

## 2015-08-11 ENCOUNTER — Ambulatory Visit (INDEPENDENT_AMBULATORY_CARE_PROVIDER_SITE_OTHER): Payer: PRIVATE HEALTH INSURANCE | Admitting: Family Medicine

## 2015-08-11 VITALS — BP 132/88 | Temp 98.4°F | Ht 73.0 in | Wt 160.0 lb

## 2015-08-11 DIAGNOSIS — J209 Acute bronchitis, unspecified: Secondary | ICD-10-CM | POA: Diagnosis not present

## 2015-08-11 DIAGNOSIS — J329 Chronic sinusitis, unspecified: Secondary | ICD-10-CM | POA: Diagnosis not present

## 2015-08-11 MED ORDER — BENZONATATE 100 MG PO CAPS: 100.0000 mg | ORAL_CAPSULE | Freq: Four times a day (QID) | ORAL | Status: DC | PRN

## 2015-08-11 MED ORDER — AMOXICILLIN-POT CLAVULANATE 875-125 MG PO TABS: 1.0000 | ORAL_TABLET | Freq: Two times a day (BID) | ORAL | Status: AC

## 2015-08-11 NOTE — Progress Notes (Signed)
   Subjective:    Patient ID: Valaria GoodJerry P Chubbuck, male    DOB: [birth year], 25 y.o.   MRN: [id number]  Cough This is a new problem. The current episode started yesterday. The cough is productive of sputum. Associated symptoms include rhinorrhea, a sore throat and wheezing. Treatments tried: OTC Allergy medication, Tylenol.   Tried zyrtec and tylenol Notes wheezing, no hx of ths in the past Patient states no other concerns this visit. No significant fever cough is productive of yellowish phlegm. Progressive over the past week.   Review of Systems  HENT: Positive for rhinorrhea and sore throat.   Respiratory: Positive for cough and wheezing.        Objective:   Physical Exam  Alert, mild malaise. Hydration good Vitals stable. frontal/ maxillary tenderness evident positive nasal congestion. pharynx normal neck supple  lungs clear/no crackles or wheezes. heart regular in rhythm       Assessment & Plan:  Impression rhinosinusitis plus acute bronchitis likely post viral, discussed with patient. plan antibiotics prescribed. Questions answered. Symptomatic care discussed. warning signs discussed. WSL

## 2015-11-07 ENCOUNTER — Ambulatory Visit (INDEPENDENT_AMBULATORY_CARE_PROVIDER_SITE_OTHER): Payer: PRIVATE HEALTH INSURANCE | Admitting: Family Medicine

## 2015-11-07 ENCOUNTER — Encounter: Payer: Self-pay | Admitting: Family Medicine

## 2015-11-07 VITALS — BP 118/82 | Ht 73.0 in | Wt 161.8 lb

## 2015-11-07 DIAGNOSIS — Z0000 Encounter for general adult medical examination without abnormal findings: Secondary | ICD-10-CM | POA: Diagnosis not present

## 2015-11-07 NOTE — Progress Notes (Signed)
   Subjective:    Patient ID: Tony Diaz, male    DOB: 06-03-1990, 25 y.o.   MRN: [id number]  HPI  The patient comes in today for a wellness visit.   still a little dipping of tobacco  Uses protonix only prn     A review of their health history was completed.  A review of medications was also completed.  Any needed refills; none  Eating habits: tries to eat healthy  Falls/  MVA accidents in past few months: none  Regular exercise: always on the go  Specialist pt sees on regular basis: none  Preventative health issues were discussed.   Additional concerns: none   Review of Systems  Constitutional: Negative for activity change, appetite change and fever.  HENT: Negative for congestion and rhinorrhea.   Eyes: Negative for discharge.  Respiratory: Negative for cough and wheezing.   Cardiovascular: Negative for chest pain.  Gastrointestinal: Negative for abdominal pain, blood in stool and vomiting.  Genitourinary: Negative for difficulty urinating and frequency.  Musculoskeletal: Negative for neck pain.  Skin: Negative for rash.  Allergic/Immunologic: Negative for environmental allergies and food allergies.  Neurological: Negative for weakness and headaches.  Psychiatric/Behavioral: Negative for agitation.  All other systems reviewed and are negative.      Objective:   Physical Exam  Constitutional: He appears well-developed and well-nourished.  HENT:  Head: Normocephalic and atraumatic.  Right Ear: External ear normal.  Left Ear: External ear normal.  Nose: Nose normal.  Mouth/Throat: Oropharynx is clear and moist.  Eyes: EOM are normal. Pupils are equal, round, and reactive to light.  Neck: Normal range of motion. Neck supple. No thyromegaly present.  Cardiovascular: Normal rate, regular rhythm and normal heart sounds.   No murmur heard. Pulmonary/Chest: Effort normal and breath sounds normal. No respiratory distress. He has no wheezes.  Abdominal:  Soft. Bowel sounds are normal. He exhibits no distension and no mass. There is no tenderness.  Genitourinary: Penis normal.  Musculoskeletal: Normal range of motion. He exhibits no edema.  Lymphadenopathy:    He has no cervical adenopathy.  Neurological: He is alert. He exhibits normal muscle tone.  Skin: Skin is warm and dry. No erythema.  Psychiatric: He has a normal mood and affect. His behavior is normal. Judgment normal.  Vitals reviewed.         Assessment & Plan:  Impression 1 well adult exam #2 reflux clinically stable with when necessary use of Protonix No. 3 tobacco dipping habit intermittent encouraged to stop plan diet exercise discussed

## 2016-01-12 ENCOUNTER — Encounter: Payer: Self-pay | Admitting: Family Medicine

## 2016-01-12 ENCOUNTER — Ambulatory Visit (INDEPENDENT_AMBULATORY_CARE_PROVIDER_SITE_OTHER): Payer: PRIVATE HEALTH INSURANCE | Admitting: Family Medicine

## 2016-01-12 ENCOUNTER — Ambulatory Visit (HOSPITAL_COMMUNITY)
Admission: RE | Admit: 2016-01-12 | Discharge: 2016-01-12 | Disposition: A | Discharge status: Home / Self Care | Payer: PRIVATE HEALTH INSURANCE | Source: Ambulatory Visit | Attending: Family Medicine | Admitting: Family Medicine

## 2016-01-12 VITALS — BP 128/80 | Temp 98.8°F | Ht 72.0 in | Wt 158.0 lb

## 2016-01-12 DIAGNOSIS — J329 Chronic sinusitis, unspecified: Secondary | ICD-10-CM

## 2016-01-12 DIAGNOSIS — M545 Low back pain: Secondary | ICD-10-CM | POA: Insufficient documentation

## 2016-01-12 DIAGNOSIS — M542 Cervicalgia: Secondary | ICD-10-CM

## 2016-01-12 MED ORDER — NABUMETONE 750 MG PO TABS: ORAL_TABLET | ORAL | 2 refills | Status: DC

## 2016-01-12 MED ORDER — CHLORZOXAZONE 500 MG PO TABS: 500.0000 mg | ORAL_TABLET | Freq: Three times a day (TID) | ORAL | 2 refills | Status: DC | PRN

## 2016-01-12 MED ORDER — CEFPROZIL 500 MG PO TABS: 500.0000 mg | ORAL_TABLET | Freq: Two times a day (BID) | ORAL | 0 refills | Status: DC

## 2016-01-12 NOTE — Patient Instructions (Signed)

## 2016-01-12 NOTE — Progress Notes (Signed)
   Subjective:    Patient ID: Tony Diaz, male    DOB: [birth year], 25 y.o.   MRN: [id number]  Sinusitis  This is a new problem. Episode onset: 3 days. (Cough, congestion ) Treatments tried: sudafed.  Cough productive. Positive congestion. Possible low-grade fever. Headache frontal in nature. Diminished energy.   Low Back pain. Off and on for 6 -7 months. Takes ibuprofen. Recalls no sudden injury diffuse in the low back area.  Neck pain. Started last week. , had a feeling of crick in the neck, aching worse with movement  , down both shouldrs at times. Primarily left side of neck.  Mild productive, low gr fever  Back hurts when moves a cdrtain way, low back , recalls n injury or oversuse at the start    Review of Systems  No headache, no major weight loss or weight gain, no chest pain no back pain abdominal pain no change in bowel habits complete ROS otherwise negative     Objective:   Physical Exam Alert no moderate malaise. Vitals stable. Frontal tenderness to palpation positive nasal discharge  Left lateral neck tender to palpation good range of motion of both shoulder and neck. Distal strength sensation intact.  Diffuse low back tenderness to deep palpation. Negative straight leg raise. Negative true spinal tenderness       Assessment & Plan:  Impression 1 acute rhinosinusitis discussed along with management therapy treatment #2 lateral neck pain likely cervical strain discussed #3 chronic back pain concerning with duration. No chewing neuropathic features discussed. Sent for x-rays. Plan addendum x-ray returned nothing acute. Anti-inflammatory medicine recommended. Exercises discussed encourage. Antibiotics prescribed. Symptom care discussed. WSL

## 2016-12-31 ENCOUNTER — Encounter: Payer: Self-pay | Admitting: Family Medicine

## 2016-12-31 ENCOUNTER — Ambulatory Visit (INDEPENDENT_AMBULATORY_CARE_PROVIDER_SITE_OTHER): Payer: PRIVATE HEALTH INSURANCE | Admitting: Family Medicine

## 2016-12-31 VITALS — BP 140/90 | Ht 71.5 in | Wt 158.2 lb

## 2016-12-31 DIAGNOSIS — Z13220 Encounter for screening for lipoid disorders: Secondary | ICD-10-CM

## 2016-12-31 DIAGNOSIS — R5383 Other fatigue: Secondary | ICD-10-CM | POA: Diagnosis not present

## 2016-12-31 DIAGNOSIS — Z0000 Encounter for general adult medical examination without abnormal findings: Secondary | ICD-10-CM

## 2016-12-31 NOTE — Progress Notes (Signed)
   Subjective:    Patient ID: Tony Diaz, male    DOB: 1990-07-26, 26 y.o.   MRN: [id number]  HPI The patient comes in today for a wellness visit.    A review of their health history was completed.  A review of medications was also completed.  Any needed refills; None   Eating habits: Patient states eating habits are good.   Falls/  MVA accidents in past few months: States none   Regular exercise: Patient states does not exercise outside of work. Has very physical job.  Specialist pt sees on regular basis: None   Preventative health issues were discussed.   Additional concerns: Patient has concerns of fatigue and periodically has elevated heart rate and chest pain. Chest pain every now an then, with exertion, pain is sharp lasts a few minutes, Going on for awhile,   Pt notes fatigue and tiredness overall sleeps ,  No fam hx of card disease in young folks, some congenitl heart disease  Review of Systems  Constitutional: Negative for activity change, appetite change and fever.  HENT: Negative for congestion and rhinorrhea.   Eyes: Negative for discharge.  Respiratory: Negative for cough and wheezing.   Cardiovascular: Negative for chest pain.  Gastrointestinal: Negative for abdominal pain, blood in stool and vomiting.  Genitourinary: Negative for difficulty urinating and frequency.  Musculoskeletal: Negative for neck pain.  Skin: Negative for rash.  Allergic/Immunologic: Negative for environmental allergies and food allergies.  Neurological: Negative for weakness and headaches.  Psychiatric/Behavioral: Negative for agitation.  All other systems reviewed and are negative.      Objective:   Physical Exam  Constitutional: He appears well-developed and well-nourished.  HENT:  Head: Normocephalic and atraumatic.  Right Ear: External ear normal.  Left Ear: External ear normal.  Nose: Nose normal.  Mouth/Throat: Oropharynx is clear and moist.  Eyes: Pupils are  equal, round, and reactive to light. EOM are normal.  Neck: Normal range of motion. Neck supple. No thyromegaly present.  Cardiovascular: Normal rate, regular rhythm and normal heart sounds.   No murmur heard. Pulmonary/Chest: Effort normal and breath sounds normal. No respiratory distress. He has no wheezes.  Abdominal: Soft. Bowel sounds are normal. He exhibits no distension and no mass. There is no tenderness.  Genitourinary: Penis normal.  Musculoskeletal: Normal range of motion. He exhibits no edema.  Lymphadenopathy:    He has no cervical adenopathy.  Neurological: He is alert. He exhibits normal muscle tone.  Skin: Skin is warm and dry. No erythema.  Psychiatric: He has a normal mood and affect. His behavior is normal. Judgment normal.  Vitals reviewed.         Assessment & Plan:  Impression well adult exam #2 reflux clinically stable patient now off medication #3 chronic tobacco dipping discussed encouraged to stop #4 fatigue patient notes general tiredness of late #5 transient sharp chest wall pain. Last just a few seconds not cardiac discussed plan appropriate blood work diet exercise discussed

## 2017-01-05 ENCOUNTER — Encounter: Payer: Self-pay | Admitting: Family Medicine

## 2017-01-05 LAB — LIPID PANEL
CHOLESTEROL TOTAL: 130 mg/dL (ref 100–199)
Chol/HDL Ratio: 2.5 ratio (ref 0.0–5.0)
HDL: 52 mg/dL (ref >39)
LDL Calculated: 62 mg/dL (ref 0–99)
Triglycerides: 78 mg/dL (ref 0–149)
VLDL Cholesterol Cal: 16 mg/dL (ref 5–40)

## 2017-01-05 LAB — CBC WITH DIFFERENTIAL/PLATELET
BASOS: 0 %
Basophils Absolute: 0 10*3/uL (ref 0.0–0.2)
EOS (ABSOLUTE): 0.1 10*3/uL (ref 0.0–0.4)
EOS: 2 %
HEMATOCRIT: 43 % (ref 37.5–51.0)
Hemoglobin: 14.1 g/dL (ref 13.0–17.7)
IMMATURE GRANULOCYTES: 0 %
Immature Grans (Abs): 0 10*3/uL (ref 0.0–0.1)
Lymphocytes Absolute: 1.3 10*3/uL (ref 0.7–3.1)
Lymphs: 29 %
MCH: 29.8 pg (ref 26.6–33.0)
MCHC: 32.8 g/dL (ref 31.5–35.7)
MCV: 91 fL (ref 79–97)
Monocytes Absolute: 0.4 10*3/uL (ref 0.1–0.9)
Monocytes: 10 %
NEUTROS ABS: 2.5 10*3/uL (ref 1.4–7.0)
NEUTROS PCT: 59 %
Platelets: 199 10*3/uL (ref 150–379)
RBC: 4.73 x10E6/uL (ref 4.14–5.80)
RDW: 12.4 % (ref 12.3–15.4)
WBC: 4.3 10*3/uL (ref 3.4–10.8)

## 2017-01-05 LAB — TSH: TSH: 0.774 u[IU]/mL (ref 0.450–4.500)

## 2017-01-05 LAB — HEPATIC FUNCTION PANEL
ALBUMIN: 4.6 g/dL (ref 3.5–5.5)
ALK PHOS: 72 IU/L (ref 39–117)
ALT: 19 IU/L (ref 0–44)
AST: 17 IU/L (ref 0–40)
BILIRUBIN TOTAL: 0.9 mg/dL (ref 0.0–1.2)
BILIRUBIN, DIRECT: 0.22 mg/dL (ref 0.00–0.40)
TOTAL PROTEIN: 6.2 g/dL (ref 6.0–8.5)

## 2017-01-05 LAB — BASIC METABOLIC PANEL
BUN/Creatinine Ratio: 9 (ref 9–20)
BUN: 8 mg/dL (ref 6–20)
CO2: 27 mmol/L (ref 20–29)
CREATININE: 0.89 mg/dL (ref 0.76–1.27)
Calcium: 9 mg/dL (ref 8.7–10.2)
Chloride: 99 mmol/L (ref 96–106)
GFR calc non Af Amer: 117 mL/min/{1.73_m2} (ref >59)
GFR, EST AFRICAN AMERICAN: 135 mL/min/{1.73_m2} (ref >59)
Glucose: 89 mg/dL (ref 65–99)
POTASSIUM: 4.3 mmol/L (ref 3.5–5.2)
Sodium: 138 mmol/L (ref 134–144)

## 2017-01-09 ENCOUNTER — Encounter: Payer: Self-pay | Admitting: Family Medicine

## 2017-06-07 ENCOUNTER — Encounter: Payer: Self-pay | Admitting: Family Medicine

## 2017-06-07 ENCOUNTER — Ambulatory Visit: Payer: PRIVATE HEALTH INSURANCE | Admitting: Family Medicine

## 2017-06-07 VITALS — BP 116/72 | Temp 99.4°F | Ht 71.5 in | Wt 165.4 lb

## 2017-06-07 DIAGNOSIS — N410 Acute prostatitis: Secondary | ICD-10-CM | POA: Diagnosis not present

## 2017-06-07 MED ORDER — TAMSULOSIN HCL 0.4 MG PO CAPS: 0.4000 mg | ORAL_CAPSULE | Freq: Every day | ORAL | 0 refills | Status: DC

## 2017-06-07 MED ORDER — CIPROFLOXACIN HCL 750 MG PO TABS: ORAL_TABLET | ORAL | Status: DC

## 2017-06-07 MED ORDER — CIPROFLOXACIN HCL 750 MG PO TABS: ORAL_TABLET | ORAL | 0 refills | Status: DC

## 2017-06-07 NOTE — Progress Notes (Signed)
   Subjective:    Patient ID: Tony Diaz, male    DOB: 23-Dec-1990, 27 y.o.   MRN: [id number]  HPI  Patient arrives with dysuria and weak urine stream-Patient states he has a history of prostate infection in the past.  Patient is also having sinus issues with fever and cough and chills and body aches. Results for orders placed or performed in visit on 12/31/16  Lipid panel  Result Value Ref Range   Cholesterol, Total 130 100 - 199 mg/dL   Triglycerides 78 0 - 149 mg/dL   HDL 52 >40>39 mg/dL   VLDL Cholesterol Cal 16 5 - 40 mg/dL   LDL Calculated 62 0 - 99 mg/dL   Chol/HDL Ratio 2.5 0.0 - 5.0 ratio  Hepatic function panel  Result Value Ref Range   Total Protein 6.2 6.0 - 8.5 g/dL   Albumin 4.6 3.5 - 5.5 g/dL   Bilirubin Total 0.9 0.0 - 1.2 mg/dL   Bilirubin, Direct 9.810.22 0.00 - 0.40 mg/dL   Alkaline Phosphatase 72 39 - 117 IU/L   AST 17 0 - 40 IU/L   ALT 19 0 - 44 IU/L  Basic metabolic panel  Result Value Ref Range   Glucose 89 65 - 99 mg/dL   BUN 8 6 - 20 mg/dL   Creatinine, Ser 1.910.89 0.76 - 1.27 mg/dL   GFR calc non Af Amer 117 >59 mL/min/1.73   GFR calc Af Amer 135 >59 mL/min/1.73   BUN/Creatinine Ratio 9 9 - 20   Sodium 138 134 - 144 mmol/L   Potassium 4.3 3.5 - 5.2 mmol/L   Chloride 99 96 - 106 mmol/L   CO2 27 20 - 29 mmol/L   Calcium 9.0 8.7 - 10.2 mg/dL  CBC with Differential/Platelet  Result Value Ref Range   WBC 4.3 3.4 - 10.8 x10E3/uL   RBC 4.73 4.14 - 5.80 x10E6/uL   Hemoglobin 14.1 13.0 - 17.7 g/dL   Hematocrit 47.843.0 29.537.5 - 51.0 %   MCV 91 79 - 97 fL   MCH 29.8 26.6 - 33.0 pg   MCHC 32.8 31.5 - 35.7 g/dL   RDW 62.112.4 30.812.3 - 65.715.4 %   Platelets 199 150 - 379 x10E3/uL   Neutrophils 59 Not Estab. %   Lymphs 29 Not Estab. %   Monocytes 10 Not Estab. %   Eos 2 Not Estab. %   Basos 0 Not Estab. %   Neutrophils Absolute 2.5 1.4 - 7.0 x10E3/uL   Lymphocytes Absolute 1.3 0.7 - 3.1 x10E3/uL   Monocytes Absolute 0.4 0.1 - 0.9 x10E3/uL   EOS (ABSOLUTE) 0.1 0.0 -  0.4 x10E3/uL   Basophils Absolute 0.0 0.0 - 0.2 x10E3/uL   Immature Granulocytes 0 Not Estab. %   Immature Grans (Abs) 0.0 0.0 - 0.1 x10E3/uL  TSH  Result Value Ref Range   TSH 0.774 0.450 - 4.500 uIU/mL    Review of Systems No feverHigh fever no rash.  No vomiting no diarrhea minimal cough    Objective:   Physical Exam  Alert active good hydration mild malaise lungs clear heart regular rate and rhythm abdomen benign prostate enlarged tender boggy  Impression acute prostatitis antibiotics prescribed symptom care discussed add Flomax warning signs discussed      Assessment & Plan:

## 2018-01-15 ENCOUNTER — Encounter: Payer: Self-pay | Admitting: Cardiovascular Disease

## 2018-01-18 ENCOUNTER — Encounter: Payer: Self-pay | Admitting: Family Medicine

## 2018-01-18 ENCOUNTER — Ambulatory Visit (INDEPENDENT_AMBULATORY_CARE_PROVIDER_SITE_OTHER): Payer: PRIVATE HEALTH INSURANCE | Admitting: Family Medicine

## 2018-01-18 VITALS — BP 132/96 | Temp 98.3°F | Ht 71.5 in | Wt 164.0 lb

## 2018-01-18 DIAGNOSIS — R000 Tachycardia, unspecified: Secondary | ICD-10-CM | POA: Diagnosis not present

## 2018-01-18 DIAGNOSIS — W57XXXA Bitten or stung by nonvenomous insect and other nonvenomous arthropods, initial encounter: Secondary | ICD-10-CM

## 2018-01-18 DIAGNOSIS — R21 Rash and other nonspecific skin eruption: Secondary | ICD-10-CM | POA: Diagnosis not present

## 2018-01-18 NOTE — Progress Notes (Signed)
   Subjective:    Patient ID: Tony Diaz, male    DOB: 07/13/1990, 27 y.o.   MRN: [id number]  HPI Patient is here today to follow up on a recent ed visit at [facility]. They state he started having Chest pain, shakiness,Pale,sweating, hands and feet went numb,shortness of breath, Rash on chest the went a up to face.  he Ed told them he should be reevaluate here for alpha gal, he had [place] spotted fever six years ago. They also did not rule out anything cardiac either.    Wife called [facility] and has him an appointment set up for Dr. Kirtland Bouchard on next Thursday Jan 26, 2018. Per wife the hospital did two Ekg's and one showed Afib, and they did another one and it looked ok, they also did ct head and chest and they were normal per wife.   The er folks rec both card and alpha gal work up   Had pinto beans with pork and pork rinds  Hands were going numb, trouble breahing , chest pain  bp was quite high 191 over 94   Feeling shaky and felt numb  Hot splotchy rash from chest up, got I v bnadryl and famotidine    Got a chest ct and heae ct              ++++++++++++++++++++++++ +++      Review of Systems No headache, no major weight loss or weight gain, no chest pain no back pain abdominal pain no change in bowel habits complete ROS otherwise negative     Objective:   Physical Exam  Alert and oriented, vitals reviewed and stable, NAD ENT-TM's and ext canals WNL bilat via otoscopic exam Soft palate, tonsils and post pharynx WNL via oropharyngeal exam Neck-symmetric, no masses; thyroid nonpalpable and nontender Pulmonary-no tachypnea or accessory muscle use; Clear without wheezes via auscultation Card--no abnrml murmurs, rhythm reg and rate WNL Carotid pulses symmetric, without bruits  Emergency room record reviewed     Assessment & Plan:  Impression spell of rash and elevated blood pressure and atypical chest discomfort and anxiety after  eating red meat.  Long discussion held.  Potentially could represent alpha gal.  Patient does get multiple tick bites.  And has had multiple [place] tick bites.  I reviewed I do see cardiologist with potential cardiac symptoms.  Multiple questions answered.  Further recommendations based on alpha gal  Greater than 50% of this 25 minute face to face visit was spent in counseling and discussion and coordination of care regarding the above diagnosis/diagnosies

## 2018-01-23 ENCOUNTER — Telehealth: Payer: Self-pay | Admitting: Family Medicine

## 2018-01-23 LAB — ALPHA-GAL PANEL
Alpha Gal IgE*: <0.1 kU/L (ref <0.10)
Beef (Bos spp) IgE: <0.1 kU/L (ref <0.35)
Class Interpretation: 0
LAMB CLASS INTERPRETATION: 0
Lamb/Mutton (Ovis spp) IgE: <0.1 kU/L (ref <0.35)
PORK CLASS INTERPRETATION: 0
Pork (Sus spp) IgE: <0.1 kU/L (ref <0.35)

## 2018-01-23 NOTE — Telephone Encounter (Signed)
Patient would like results of labs that were done on 01/18/18

## 2018-01-23 NOTE — Telephone Encounter (Signed)
I spoke with the patient and he is aware that the labs are still pending.We will call once they are resulted.

## 2018-01-25 ENCOUNTER — Encounter: Payer: Self-pay | Admitting: *Deleted

## 2018-01-26 ENCOUNTER — Encounter: Payer: Self-pay | Admitting: *Deleted

## 2018-01-26 ENCOUNTER — Encounter: Payer: Self-pay | Admitting: Cardiovascular Disease

## 2018-01-26 ENCOUNTER — Ambulatory Visit (INDEPENDENT_AMBULATORY_CARE_PROVIDER_SITE_OTHER): Payer: PRIVATE HEALTH INSURANCE | Admitting: Cardiovascular Disease

## 2018-01-26 ENCOUNTER — Telehealth: Payer: Self-pay | Admitting: Cardiovascular Disease

## 2018-01-26 VITALS — BP 132/80 | HR 89 | Ht 74.0 in | Wt 160.0 lb

## 2018-01-26 DIAGNOSIS — Z01812 Encounter for preprocedural laboratory examination: Secondary | ICD-10-CM

## 2018-01-26 DIAGNOSIS — R9431 Abnormal electrocardiogram [ECG] [EKG]: Secondary | ICD-10-CM

## 2018-01-26 DIAGNOSIS — R079 Chest pain, unspecified: Secondary | ICD-10-CM | POA: Diagnosis not present

## 2018-01-26 DIAGNOSIS — Q249 Congenital malformation of heart, unspecified: Secondary | ICD-10-CM

## 2018-01-26 MED ORDER — METOPROLOL TARTRATE 25 MG PO TABS: 25.0000 mg | ORAL_TABLET | Freq: Once | ORAL | 0 refills | Status: AC

## 2018-01-26 NOTE — Telephone Encounter (Signed)
Pre-cert Verification for the following procedure   Echo scheduled for 02-08-2018

## 2018-01-26 NOTE — Progress Notes (Signed)
CARDIOLOGY CONSULT NOTE  Patient ID: HEVER CASTILLEJA MRN: [id number] DOB/AGE: 1990/08/01 27 y.o.  Admit date: (Not on file) Primary Physician: Merlyn Albert, MD Referring Physician: Merlyn Albert, MD  Reason for Consultation: Chest pain  HPI: Tony Diaz is a 27 y.o. male who is being seen today for the evaluation of chest pain at the request of Luking, Vilinda Blanks, MD.   He was evaluated for chest pain at [facility] on 01/15/2018.  I personally reviewed all relevant documentation, labs, and studies.  I also reviewed PCP notes dated 01/18/2018.  It was felt that his chest discomfort was atypical for coronary artery disease.  If it appears he had an erythematous allergic appearing rash on the upper torso, neck, and face.  He reportedly has allergies when eating meat over the past several years and it was recommended he be worked up for alpha gal.  Chest x-ray showed no pulmonary edema or consolidation.  Urine tox screen was negative.  Urinalysis showed trace leukocyte esterase.  Potassium was mildly low at 3.3.  Sodium and renal function were normal.  Troponins were normal.  CBC was unremarkable.  D-dimer was normal.  I personally reviewed the ECG performed at 2210 on 01/15/2018 which demonstrated sinus rhythm with incomplete right bundle branch block, QRS duration 105 ms.  I also personally reviewed the ECG performed at 1959 on 01/15/2018.  The automated computer reading showed "atrial fibrillation ".  However upon careful review there are clear sinus P waves and demonstrates sinus rhythm.  CT angiography of the chest showed no pulmonary edema or consolidation.  He is here with his wife who works at [employer], and Counselling psychologist.  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  He has not had any problems with exertional chest pain and shortness of breath.  He also denies  palpitations and fatigue.  He has had episodic chest pains over the past 2 years which typically occur at rest.  Interestingly upon further discussion, their 29-year-old son was diagnosed with a patent ductus arteriosus which was closed at [facility].  He was subsequently diagnosed with a bicuspid aortic valve and an enlarged aorta.   No Known Allergies  No current outpatient medications on file.   No current facility-administered medications for this visit.     Past Medical History:  Diagnosis Date  . ADHD (attention deficit hyperactivity disorder)   . Hard of hearing   . Hearing aid worn   . 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 Reflux   . Slow learner     Past Surgical History:  Procedure Laterality Date  . NO PAST SURGERIES      Social History   Socioeconomic History  . Marital status: Married    Spouse name: Not on file  . Number of children: Not on file  . Years of education: Not on file  . Highest education level: Not on file  Occupational History  . Not on file  Social Needs  . Financial resource strain: Not on file  . Food insecurity:    Worry: Not on file    Inability: Not on file  . Transportation needs:    Medical: Not on file    Non-medical: Not on file  Tobacco Use  . Smoking status: Former Games developer  . Smokeless tobacco: Current User  Substance and Sexual Activity  . Alcohol  use: Yes  . Drug use: No  . Sexual activity: Not on file  Lifestyle  . Physical activity:    Days per week: Not on file    Minutes per session: Not on file  . Stress: Not on file  Relationships  . Social connections:    Talks on phone: Not on file    Gets together: Not on file    Attends religious service: Not on file    Active member of club or organization: Not on file    Attends meetings of clubs or organizations: Not on file    Relationship status: Not on file  . Intimate partner violence:    Fear of current or ex partner: Not on file    Emotionally abused: Not on  file    Physically abused: Not on file    Forced sexual activity: Not on file  Other Topics Concern  . Not on file  Social History Narrative  . Not on file     No family history of premature CAD in 1st degree relatives.  No outpatient medications have been marked as taking for the 01/26/18 encounter (Office Visit) with Laqueta Linden, MD.      Review of systems complete and found to be negative unless listed above in HPI    Physical exam Blood pressure 132/80, pulse 89, height 6\' 2"  (1.88 m), weight 160 lb (72.6 kg), SpO2 98 %. General: NAD Neck: No JVD, no thyromegaly or thyroid nodule.  Lungs: Clear to auscultation bilaterally with normal respiratory effort. CV: Nondisplaced PMI. Regular rate and rhythm, normal S1/S2, no S3/S4, no murmur.  No peripheral edema.  No carotid bruit.    Abdomen: Soft, nontender, no distention.  Skin: Intact without lesions or rashes.  Neurologic: Alert and oriented x 3.  Psych: Normal affect. Extremities: No clubbing or cyanosis.  HEENT: Normal.   ECG: Most recent ECG reviewed.   Labs: Lab Results  Component Value Date/Time   K 4.3 01/04/2017 08:38 AM   BUN 8 01/04/2017 08:38 AM   CREATININE 0.89 01/04/2017 08:38 AM   ALT 19 01/04/2017 08:38 AM   TSH 0.774 01/04/2017 08:38 AM   HGB 14.1 01/04/2017 08:38 AM     Lipids: Lab Results  Component Value Date/Time   LDLCALC 62 01/04/2017 08:38 AM   CHOL 130 01/04/2017 08:38 AM   TRIG 78 01/04/2017 08:38 AM   HDL 52 01/04/2017 08:38 AM        ASSESSMENT AND PLAN:  1.  Chest pain: Atypical for ischemic heart disease.  What is interesting to note is that there are 29-year-old son has congenital heart disease with a patent ductus arteriosus, bicuspid aortic valve, and enlarged aorta.  I do not appreciate any abnormal findings on physical examination.  There is a possibility that he has anomalous coronary anatomy.  For this reason, I will obtain coronary CT angiography. I will order a  2-D echocardiogram with Doppler to evaluate cardiac structure, function, and regional wall motion.  2.  Abnormal ECG: As stated above, the ECG performed at 1959 on 01/15/2018 had an automated computer reading which showed "atrial fibrillation ".  However upon careful review there are clear sinus P waves and demonstrates sinus rhythm.   Disposition: Follow up 3 months    Signed: Prentice Docker, M.D., F.A.C.C.  01/26/2018, 11:27 AM

## 2018-01-26 NOTE — Patient Instructions (Signed)
Medication Instructions:  Continue all current medications.  Labwork:  BMET - order given today.   Do just prior to Coronary CT.  Office will contact with results via phone or letter.    Testing/Procedures:  Your physician has requested that you have an echocardiogram. Echocardiography is a painless test that uses sound waves to create images of your heart. It provides your doctor with information about the size and shape of your heart and how well your heart's chambers and valves are working. This procedure takes approximately one hour. There are no restrictions for this procedure.  Coronary CT - done at [facility].   Office will contact with results via phone or letter.    Follow-Up: 3 months   Any Other Special Instructions Will Be Listed Below (If Applicable).  If you need a refill on your cardiac medications before your next appointment, please call your pharmacy.

## 2018-02-08 ENCOUNTER — Ambulatory Visit (INDEPENDENT_AMBULATORY_CARE_PROVIDER_SITE_OTHER): Payer: PRIVATE HEALTH INSURANCE

## 2018-02-08 ENCOUNTER — Other Ambulatory Visit: Payer: Self-pay

## 2018-02-08 DIAGNOSIS — Q249 Congenital malformation of heart, unspecified: Secondary | ICD-10-CM

## 2018-02-08 DIAGNOSIS — R079 Chest pain, unspecified: Secondary | ICD-10-CM

## 2018-02-13 ENCOUNTER — Telehealth: Payer: Self-pay | Admitting: *Deleted

## 2018-02-13 NOTE — Telephone Encounter (Signed)
Notes recorded by Lesle ChrisHill, Jimya Ciani G, LPN on 78/29/562111/25/2019 at 8:57 AM EST Patient notified. Copy to pmd. Follow up scheduled for February with Dr. Purvis SheffieldKoneswaran.   ------  Notes recorded by Azalee CourseMeng, Hao, [place] on 02/12/2018 at 9:31 PM EST Covering for Dr. Purvis SheffieldKoneswaran. Normal pumping function of heart, no obvious structural heart issue noted. This is a normal echo with normal finding

## 2018-03-02 ENCOUNTER — Encounter: Payer: Self-pay | Admitting: Cardiovascular Disease

## 2018-04-03 ENCOUNTER — Telehealth (HOSPITAL_COMMUNITY): Payer: Self-pay | Admitting: Emergency Medicine

## 2018-04-03 NOTE — Telephone Encounter (Signed)
Unable to leave message on both phone numbers, "voicemail box not set up yet" Will send a message via mychart Rockwell Alexandria RN Navigator Cardiac Imaging [phone]

## 2018-04-04 ENCOUNTER — Ambulatory Visit (HOSPITAL_COMMUNITY)
Admission: RE | Admit: 2018-04-04 | Discharge: 2018-04-04 | Disposition: A | Discharge status: Home / Self Care | Payer: PRIVATE HEALTH INSURANCE | Source: Ambulatory Visit | Attending: Cardiovascular Disease | Admitting: Cardiovascular Disease

## 2018-04-04 ENCOUNTER — Telehealth (HOSPITAL_COMMUNITY): Payer: Self-pay | Admitting: Emergency Medicine

## 2018-04-04 DIAGNOSIS — Q249 Congenital malformation of heart, unspecified: Secondary | ICD-10-CM

## 2018-04-04 DIAGNOSIS — R079 Chest pain, unspecified: Secondary | ICD-10-CM | POA: Insufficient documentation

## 2018-04-04 MED ORDER — NITROGLYCERIN 0.4 MG SL SUBL
0.8000 mg | SUBLINGUAL_TABLET | Freq: Once | SUBLINGUAL | Status: AC
  Administered 2018-04-04: 0.8 mg via SUBLINGUAL
  Filled 2018-04-04: qty 25

## 2018-04-04 MED ORDER — IOPAMIDOL (ISOVUE-370) INJECTION 76%
80.0000 mL | Freq: Once | INTRAVENOUS | Status: AC | PRN
  Administered 2018-04-04: 80 mL via INTRAVENOUS

## 2018-04-04 MED ORDER — NITROGLYCERIN 0.4 MG SL SUBL
SUBLINGUAL_TABLET | SUBLINGUAL | Status: AC
  Filled 2018-04-04: qty 2

## 2018-04-04 NOTE — Progress Notes (Signed)
CT complete. Patient denies any complaints. Offered patient something to eat and drink.

## 2018-04-04 NOTE — Progress Notes (Signed)
Patient ambulatory out of department with steady gait noted. Denies any complaitns.  

## 2018-04-04 NOTE — Telephone Encounter (Signed)
error 

## 2018-04-05 ENCOUNTER — Telehealth: Payer: Self-pay | Admitting: Cardiovascular Disease

## 2018-04-05 NOTE — Telephone Encounter (Signed)
Notes recorded by Lesle Chris, LPN on 07/28/3265 at 10:14 AM EST Wife Morrie Sheldon) notified via detailed voice message. ------  Notes recorded by Laqueta Linden, MD on 04/04/2018 at 4:14 PM EST No extracardiac findings. Await cardiac report.

## 2018-04-05 NOTE — Telephone Encounter (Signed)
Tony Diaz (wife is calling) to see if we have test results back from the CT that was [facility] .  Please call [phone].

## 2018-04-06 ENCOUNTER — Telehealth: Payer: Self-pay | Admitting: Cardiovascular Disease

## 2018-04-06 ENCOUNTER — Telehealth: Payer: Self-pay | Admitting: *Deleted

## 2018-04-06 NOTE — Telephone Encounter (Signed)
Cardiac ct results given to wife Morrie Sheldon again. Verbalized understanding

## 2018-04-06 NOTE — Telephone Encounter (Signed)
Notes recorded by Lesle Chris, LPN on 2/83/1517 at 8:19 AM EST Patient notified and verbalized understanding. Copy to pmd. ------  Notes recorded by Laqueta Linden, MD on 04/05/2018 at 1:45 PM EST No CAD. Coronary calcium score of 0. Can follow up as needed (can cancel follow up appt). ------  Notes recorded by Lesle Chris, LPN on 09/05/735 at 10:14 AM EST Wife Morrie Sheldon) notified via detailed voice message. ------  Notes recorded by Laqueta Linden, MD on 04/04/2018 at 4:14 PM EST No extracardiac findings. Await cardiac report.

## 2018-04-06 NOTE — Telephone Encounter (Signed)
Asking for results °

## 2018-05-02 ENCOUNTER — Ambulatory Visit: Payer: PRIVATE HEALTH INSURANCE | Admitting: Cardiovascular Disease

## 2018-05-03 ENCOUNTER — Encounter: Payer: Self-pay | Admitting: Cardiovascular Disease

## 2018-05-31 ENCOUNTER — Ambulatory Visit: Payer: PRIVATE HEALTH INSURANCE | Admitting: Family Medicine

## 2018-05-31 ENCOUNTER — Encounter: Payer: Self-pay | Admitting: Family Medicine

## 2018-05-31 ENCOUNTER — Other Ambulatory Visit: Payer: Self-pay

## 2018-05-31 VITALS — BP 140/82 | Temp 98.7°F | Ht 71.5 in | Wt 169.1 lb

## 2018-05-31 DIAGNOSIS — R300 Dysuria: Secondary | ICD-10-CM

## 2018-05-31 DIAGNOSIS — N410 Acute prostatitis: Secondary | ICD-10-CM | POA: Diagnosis not present

## 2018-05-31 LAB — POCT URINALYSIS DIPSTICK
Blood, UA: NEGATIVE
Leukocytes, UA: NEGATIVE
Spec Grav, UA: 1.015 (ref 1.010–1.025)
pH, UA: 6 (ref 5.0–8.0)

## 2018-05-31 MED ORDER — TAMSULOSIN HCL 0.4 MG PO CAPS: 0.4000 mg | ORAL_CAPSULE | Freq: Every day | ORAL | 3 refills | Status: AC

## 2018-05-31 MED ORDER — CIPROFLOXACIN HCL 750 MG PO TABS: ORAL_TABLET | ORAL | 0 refills | Status: AC

## 2018-05-31 NOTE — Progress Notes (Signed)
   Subjective:    Patient ID: Tony Diaz, male    DOB: 1990-10-12, 28 y.o.   MRN: [id number]  HPI   Patient is here today with complaints of trouble urinating and fever.  Symptoms of burning while urinating and back pain started yesterday.  He also has some sinus drainage and scratchy throat.  He has been taking tylenol. Results for orders placed or performed in visit on 05/31/18  POCT urinalysis dipstick  Result Value Ref Range   Color, UA     Clarity, UA     Glucose, UA     Bilirubin, UA     Ketones, UA     Spec Grav, UA 1.015 1.010 - 1.025   Blood, UA Negative    pH, UA 6.0 5.0 - 8.0   Protein, UA     Urobilinogen, UA     Nitrite, UA     Leukocytes, UA Negative Negative   Appearance     Odor       Review of Systems Results for orders placed or performed in visit on 05/31/18  POCT urinalysis dipstick  Result Value Ref Range   Color, UA     Clarity, UA     Glucose, UA     Bilirubin, UA     Ketones, UA     Spec Grav, UA 1.015 1.010 - 1.025   Blood, UA Negative    pH, UA 6.0 5.0 - 8.0   Protein, UA     Urobilinogen, UA     Nitrite, UA     Leukocytes, UA Negative Negative   Appearance     Odor         Objective:   Physical Exam  Alert vitals stable, NAD. Blood pressure good on repeat. HEENT normal. Lungs clear. Heart regular rate and rhythm. Prostate boggy and tender to palpation      Assessment & Plan:  Impression acute prostatitis plan antibiotics prescribed care discussed warning signs discussed

## 2019-04-23 ENCOUNTER — Encounter: Payer: Self-pay | Admitting: Family Medicine

## 2020-12-06 IMAGING — CT CT HEART MORP W/ CTA COR W/ SCORE W/ CA W/CM &/OR W/O CM
4 of 7 series · 8 of 20 positions shown, 9 images · non-contrast
Comparison: CTA chest 01/15/2018.

Addendum:
EXAM:
OVER-READ INTERPRETATION CT CHEST

The following report is an over-read performed by radiologist Dr.
over-read does not include interpretation of cardiac or coronary
anatomy or pathology. The coronary calcium score/coronary CTA
interpretation by the cardiologist is attached.
CLINICAL DATA: 27M with chest pain and family history of congenital
heart disease.
Cardiac/Coronary  CT
TECHNIQUE: The patient was scanned on a Phillips Force scanner.

[Series 6: best diast 78 % · axial · 0.39mm/px · z∈[+1234,+1283]mm · 2 of 368 slices shown, 3 images]
[im 123/368  vessel]
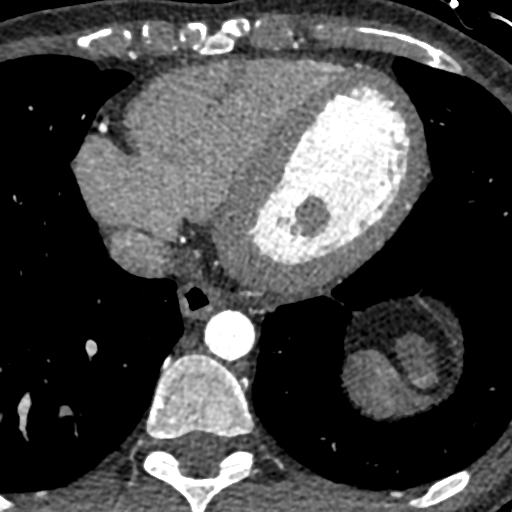
[im 123/368  lung]
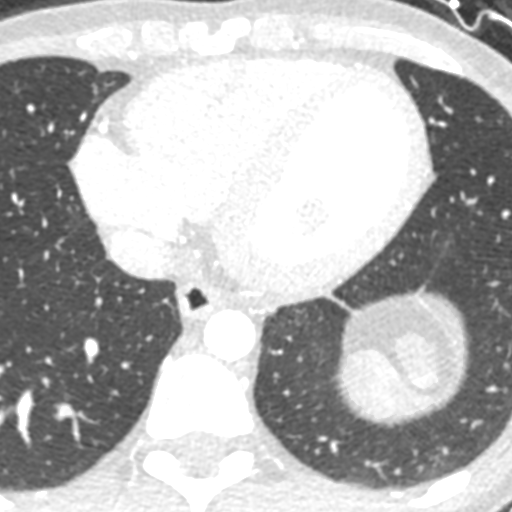
[im 245/368  vessel]
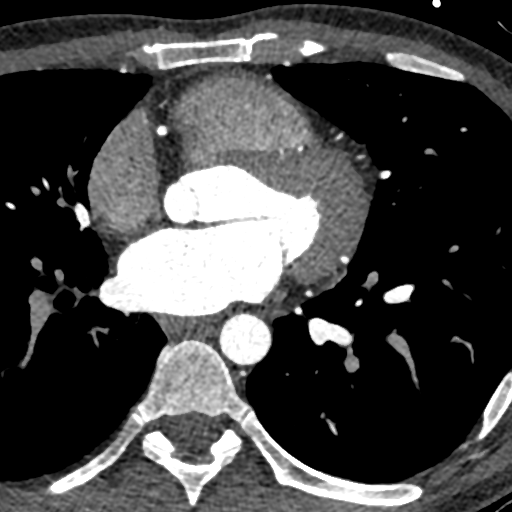

[Series 7: best syst 33 % · axial · 0.39mm/px · z∈[+1234,+1283]mm · 2 of 368 slices shown]
[im 123/368  vessel]
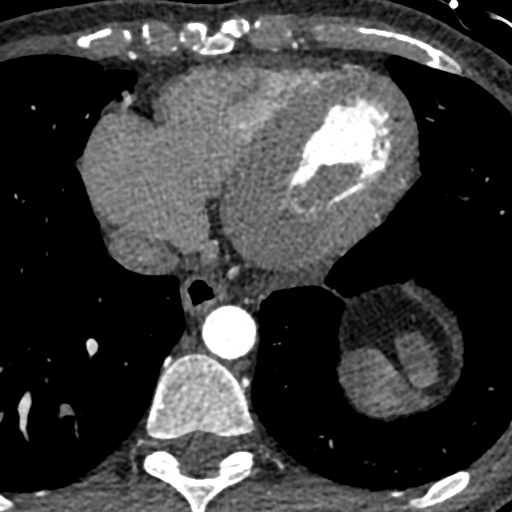
[im 245/368  vessel]
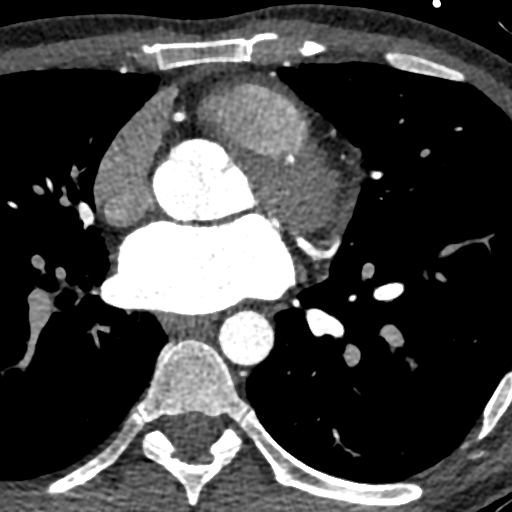

[Series 8: ts diast sharp 78 % · axial · 0.39mm/px · z∈[+1234,+1283]mm · 2 of 368 slices shown]
[im 123/368  lung]
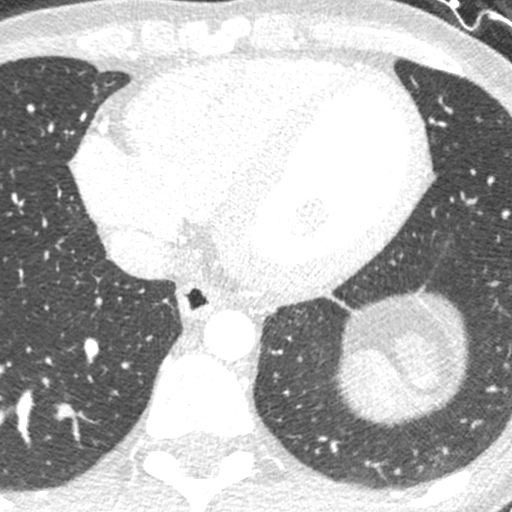
[im 245/368  lung]
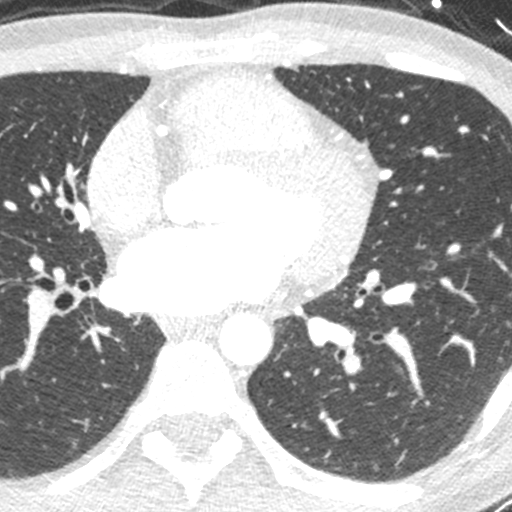

[Series 9: ts syst sharp 33 % · axial · 0.39mm/px · z∈[+1234,+1283]mm · 2 of 368 slices shown]
[im 123/368  lung]
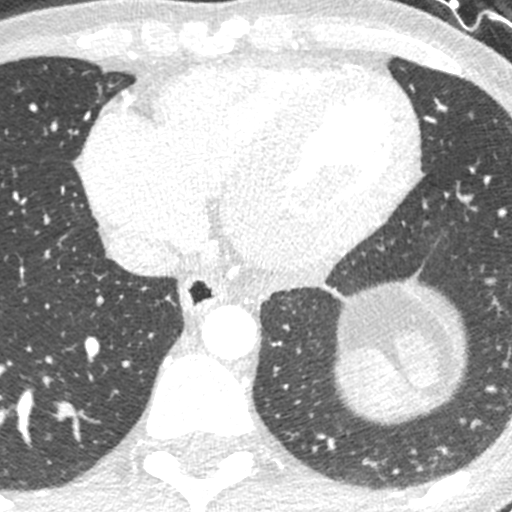
[im 245/368  lung]
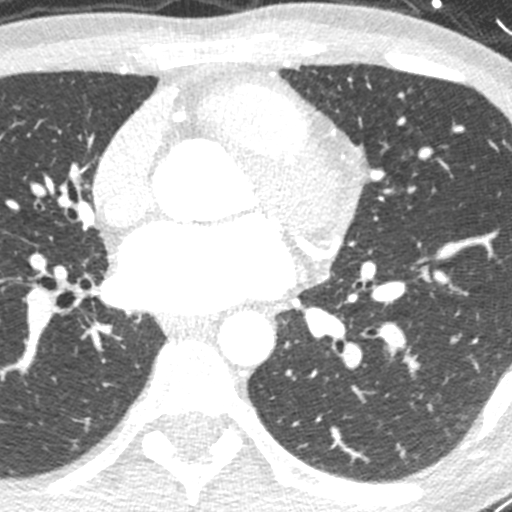

[8 of 20 positions shown; findings below may reference images not displayed]

FINDINGS: Vascular: No visible atherosclerosis and no evidence of aneurysm
involving the visualized aorta.

Mediastinum/Nodes: No pathologic lymphadenopathy within the
visualized mediastinum. Visualized esophagus normal in appearance.

Lungs/Pleura: Visualized lung parenchyma clear. Central bronchi
patent without significant bronchial wall thickening. No pleural
effusions.

Upper Abdomen: Unremarkable for the early arterial phase of
enhancement which accounts for the heterogeneous enhancement of the
spleen.

Musculoskeletal: Visualized skeleton normal in appearance.
IMPRESSION: No extracardiac findings.
FINDINGS: A 120 kV prospective scan was triggered in the descending thoracic
aorta at 111 HU's. Axial non-contrast 3 mm slices were carried out
through the heart. The data set was analyzed on a dedicated work
station and scored using the Agatson method. Gantry rotation speed
was 250 msecs and collimation was .6 mm. No beta blockade and 0.8 mg
of sl NTG was given. The 3D data set was reconstructed in 5%
intervals of the 67-82 % of the R-R cycle. Diastolic phases were
analyzed on a dedicated work station using MPR, MIP and VRT modes.
The patient received 80 cc of contrast.

Aorta: Normal size. Ascending aorta 3.2 cm. No calcifications. No
dissection.

Aortic Valve:  Trileaflet.  No calcifications.

Coronary Arteries:  Normal coronary origin.  Right dominance.

RCA is a large dominant artery that gives rise to PDA and PLVB.
There is no plaque.

Left main is a large artery that gives rise to LAD and LCX arteries.

LAD is a large vessel that has no plaque.

LCX is a non-dominant artery that gives rise to one large OM1
branch. There is no plaque.

Other findings:

Normal pulmonary vein drainage into the left atrium.

Normal let atrial appendage without a thrombus.

Normal size of the pulmonary artery.
IMPRESSION: 1. Coronary calcium score of 0. This was 0 percentile for age and
sex matched control.

2. Normal coronary origin with right dominance.

3. No evidence of CAD.

*** End of Addendum ***
